# Patient Record
Sex: Female | Born: 1980 | Race: White | Hispanic: No | Smoking: Never smoker
Health system: Southern US, Community
[De-identification: ages and names within clinical notes are randomized; demographics above are authoritative.]

## PROBLEM LIST (undated history)

## (undated) DIAGNOSIS — M069 Rheumatoid arthritis, unspecified: Secondary | ICD-10-CM

## (undated) DIAGNOSIS — R51 Headache: Secondary | ICD-10-CM

## (undated) DIAGNOSIS — R569 Unspecified convulsions: Secondary | ICD-10-CM

## (undated) DIAGNOSIS — F419 Anxiety disorder, unspecified: Secondary | ICD-10-CM

## (undated) DIAGNOSIS — F4010 Social phobia, unspecified: Secondary | ICD-10-CM

## (undated) DIAGNOSIS — R232 Flushing: Secondary | ICD-10-CM

## (undated) DIAGNOSIS — M797 Fibromyalgia: Secondary | ICD-10-CM

## (undated) DIAGNOSIS — R55 Syncope and collapse: Secondary | ICD-10-CM

## (undated) DIAGNOSIS — R519 Headache, unspecified: Secondary | ICD-10-CM

## (undated) DIAGNOSIS — F4310 Post-traumatic stress disorder, unspecified: Secondary | ICD-10-CM

## (undated) HISTORY — DX: Post-traumatic stress disorder, unspecified: F43.10

## (undated) HISTORY — DX: Fibromyalgia: M79.7

## (undated) HISTORY — DX: Rheumatoid arthritis, unspecified: M06.9

---

## 2006-05-28 ENCOUNTER — Observation Stay: Payer: Self-pay

## 2006-08-21 ENCOUNTER — Inpatient Hospital Stay: Payer: Self-pay

## 2006-09-02 ENCOUNTER — Emergency Department: Payer: Self-pay | Admitting: General Practice

## 2014-02-11 DIAGNOSIS — R519 Headache, unspecified: Secondary | ICD-10-CM | POA: Insufficient documentation

## 2014-02-11 DIAGNOSIS — F4010 Social phobia, unspecified: Secondary | ICD-10-CM | POA: Insufficient documentation

## 2014-02-11 DIAGNOSIS — F411 Generalized anxiety disorder: Secondary | ICD-10-CM | POA: Insufficient documentation

## 2014-02-11 DIAGNOSIS — R55 Syncope and collapse: Secondary | ICD-10-CM | POA: Insufficient documentation

## 2014-02-11 DIAGNOSIS — R51 Headache: Secondary | ICD-10-CM

## 2014-02-11 DIAGNOSIS — F419 Anxiety disorder, unspecified: Secondary | ICD-10-CM | POA: Insufficient documentation

## 2014-04-05 DIAGNOSIS — R6889 Other general symptoms and signs: Secondary | ICD-10-CM

## 2014-04-05 DIAGNOSIS — R569 Unspecified convulsions: Secondary | ICD-10-CM | POA: Insufficient documentation

## 2014-04-05 DIAGNOSIS — IMO0001 Reserved for inherently not codable concepts without codable children: Secondary | ICD-10-CM | POA: Insufficient documentation

## 2015-02-01 ENCOUNTER — Emergency Department
Admission: EM | Admit: 2015-02-01 | Discharge: 2015-02-01 | Disposition: A | Discharge status: Home / Self Care | Payer: Medicaid Other | Attending: Emergency Medicine | Admitting: Emergency Medicine

## 2015-02-01 ENCOUNTER — Emergency Department: Payer: Medicaid Other

## 2015-02-01 ENCOUNTER — Encounter: Payer: Self-pay | Admitting: Emergency Medicine

## 2015-02-01 DIAGNOSIS — S3992XA Unspecified injury of lower back, initial encounter: Secondary | ICD-10-CM | POA: Diagnosis not present

## 2015-02-01 DIAGNOSIS — S7011XA Contusion of right thigh, initial encounter: Secondary | ICD-10-CM | POA: Diagnosis not present

## 2015-02-01 DIAGNOSIS — Y9389 Activity, other specified: Secondary | ICD-10-CM | POA: Insufficient documentation

## 2015-02-01 DIAGNOSIS — W108XXA Fall (on) (from) other stairs and steps, initial encounter: Secondary | ICD-10-CM | POA: Insufficient documentation

## 2015-02-01 DIAGNOSIS — S50311A Abrasion of right elbow, initial encounter: Secondary | ICD-10-CM | POA: Insufficient documentation

## 2015-02-01 DIAGNOSIS — Y998 Other external cause status: Secondary | ICD-10-CM | POA: Diagnosis not present

## 2015-02-01 DIAGNOSIS — M545 Low back pain, unspecified: Secondary | ICD-10-CM

## 2015-02-01 DIAGNOSIS — Z3202 Encounter for pregnancy test, result negative: Secondary | ICD-10-CM | POA: Diagnosis not present

## 2015-02-01 DIAGNOSIS — S80211A Abrasion, right knee, initial encounter: Secondary | ICD-10-CM | POA: Insufficient documentation

## 2015-02-01 DIAGNOSIS — S161XXA Strain of muscle, fascia and tendon at neck level, initial encounter: Secondary | ICD-10-CM | POA: Diagnosis not present

## 2015-02-01 DIAGNOSIS — S299XXA Unspecified injury of thorax, initial encounter: Secondary | ICD-10-CM | POA: Insufficient documentation

## 2015-02-01 DIAGNOSIS — S199XXA Unspecified injury of neck, initial encounter: Secondary | ICD-10-CM | POA: Diagnosis present

## 2015-02-01 DIAGNOSIS — M546 Pain in thoracic spine: Secondary | ICD-10-CM

## 2015-02-01 DIAGNOSIS — Y9289 Other specified places as the place of occurrence of the external cause: Secondary | ICD-10-CM | POA: Insufficient documentation

## 2015-02-01 HISTORY — DX: Unspecified convulsions: R56.9

## 2015-02-01 HISTORY — DX: Anxiety disorder, unspecified: F41.9

## 2015-02-01 HISTORY — DX: Social phobia, unspecified: F40.10

## 2015-02-01 HISTORY — DX: Headache, unspecified: R51.9

## 2015-02-01 HISTORY — DX: Flushing: R23.2

## 2015-02-01 HISTORY — DX: Headache: R51

## 2015-02-01 HISTORY — DX: Syncope and collapse: R55

## 2015-02-01 LAB — POCT PREGNANCY, URINE: Preg Test, Ur: NEGATIVE

## 2015-02-01 MED ORDER — NAPROXEN 500 MG PO TABS
500.0000 mg | ORAL_TABLET | Freq: Two times a day (BID) | ORAL | Status: DC
Start: 2015-02-01 — End: 2015-10-15

## 2015-02-01 MED ORDER — HYDROCODONE-ACETAMINOPHEN 5-325 MG PO TABS: 1.0000 | ORAL_TABLET | ORAL | Status: DC | PRN

## 2015-02-01 MED ORDER — HYDROCODONE-ACETAMINOPHEN 5-325 MG PO TABS
1.0000 | ORAL_TABLET | Freq: Once | ORAL | Status: AC
  Administered 2015-02-01: 1 via ORAL
  Filled 2015-02-01: qty 1

## 2015-02-01 NOTE — ED Provider Notes (Signed)
[facility] Emergency Department Provider Note  ____________________________________________  Time seen: Approximately 5:19 PM  I have reviewed the triage vital signs and the nursing notes.   HISTORY  Chief Complaint Fall   HPI Morgan [name] is a 34 y.o. female is here after falling down her porch steps. Patient states that she fell down approximately 5 concrete steps and thinks that she tripped over her cat. She states that she landed on concrete but denies any head injury or loss of consciousness. She states her neck is hurting, her right thigh and knee, also her back is hurting. She denies any lacerations or abrasions. She is not taking any over-the-counter medication prior to coming to the emergency room. She denies any paresthesias in her arms. She denies any previous injuries to her neck or back. Currently her pain is 8 out of 10.   Past Medical History  Diagnosis Date  . Anxiety   . Seizures   . HA (headache)   . Hot flashes   . Social phobia   . Convulsions   . Syncope and collapse     There are no active problems to display for this patient.   History reviewed. No pertinent past surgical history.  Current Outpatient Rx  Name  Route  Sig  Dispense  Refill  . HYDROcodone-acetaminophen (NORCO/VICODIN) 5-325 MG per tablet   Oral   Take 1 tablet by mouth every 4 (four) hours as needed for moderate pain.   20 tablet   0   . naproxen (NAPROSYN) 500 MG tablet   Oral   Take 1 tablet (500 mg total) by mouth 2 (two) times daily with a meal.   30 tablet   0     Allergies Neurontin; Nortriptyline; Lamotrigine; and Levetiracetam  No family history on file.  Social History Social History  Substance Use Topics  . Smoking status: Never Smoker   . Smokeless tobacco: Never Used  . Alcohol Use: No    Review of Systems Constitutional: No fever/chills Eyes: No visual changes. ENT: No sore throat. Cardiovascular: Denies chest  pain. Respiratory: Denies shortness of breath. Gastrointestinal: No abdominal pain.  No nausea, no vomiting.   Musculoskeletal: Positive for cervical thoracic and lumbar pain. Positive for right thigh pain. Skin: Negative for rash. Neurological: Negative for headaches, focal weakness or numbness.  10-point ROS otherwise negative.  ____________________________________________   PHYSICAL EXAM:  VITAL SIGNS: ED Triage Vitals  Enc Vitals Group     BP 02/01/15 1638 135/85 mmHg     Pulse Rate 02/01/15 1638 81     Resp 02/01/15 1638 18     Temp 02/01/15 1638 98.4 F (36.9 C)     Temp src --      SpO2 02/01/15 1638 98 %     Weight 02/01/15 1638 120 lb (54.432 kg)     Height 02/01/15 1638  (1.651 m)     Head Cir --      Peak Flow --      Pain Score 02/01/15 1638 8     Pain Loc --      Pain Edu? --      Excl. in GC? --     Constitutional: Alert and oriented. Well appearing and in no acute distress. Eyes: Conjunctivae are normal. PERRL. EOMI. Head: Atraumatic. Nose: No congestion/rhinnorhea. Neck: No stridor.  Tender cervical spine with cervical collar in place. Range of motion was not tested Cardiovascular: Normal rate, regular rhythm. Grossly normal heart sounds.  Good peripheral circulation. Respiratory: Normal respiratory effort.  No retractions. Lungs CTAB. Gastrointestinal: Soft and nontender. No distention. Bowel sounds are active in 4 quadrants Musculoskeletal: See above cervical spine exam. Right by moderate tenderness on palpation lateral aspect. No obvious deformity and no shortening of limb. Range of motion is restricted secondary to pain.   No joint effusions. Thoracic and lumbar spine moderate tenderness on palpation generalized both on the spine and paravertebral muscles bilaterally. Range of motion is guarded secondary to pain. No active muscle spasms are seen. Neurologic:  Normal speech and language. No gross focal neurologic deficits are appreciated. No gait  instability. Skin:  Skin is warm, dry and intact. No rash noted. There is superficial abrasion to the right elbow and right knee. No active bleeding was noted. Psychiatric: Mood and affect are normal. Speech and behavior are normal.  ____________________________________________   LABS (all labs ordered are listed, but only abnormal results are displayed)  Labs Reviewed  POC URINE PREG, ED  POCT PREGNANCY, URINE    RADIOLOGY  Right femur no fracture per radiologist and reviewed by me. Thoracic spine x-ray no fracture I, Tommi Rumps, personally viewed and evaluated these images (plain radiographs) as part of my medical decision making.   CT cervical spine per radiologist negative exam  ____________________________________________   PROCEDURES  Procedure(s) performed: None  Critical Care performed: No  ____________________________________________   INITIAL IMPRESSION / ASSESSMENT AND PLAN / ED COURSE  Pertinent labs & imaging results that were available during my care of the patient were reviewed by me and considered in my medical decision making (see chart for details  patient is aware that she is going to be extremely sore even with medication. She is use ice or heat to muscles. She is given a prescription for naproxen 500 mg twice a day with food and Norco as needed for severe pain. She is to follow-up with her regular physician if any continued problems.  ____________________________________________   FINAL CLINICAL IMPRESSION(S) / ED DIAGNOSES  Final diagnoses:  Cervical strain, acute, initial encounter  Acute lumbar back pain  Acute thoracic back pain  Contusion of right thigh, initial encounter      Tommi Rumps, PA-C 02/01/15 2032  Sharman Cheek, MD 02/02/15 [phone]

## 2015-02-01 NOTE — ED Notes (Signed)
Pt reports falling down porch steps about 4-5 steps. States she thinks one of her cats tripped her. States she did hit her head denies LOC. Ambulatory at site. Pain to right thigh and right knee. Back pain and neck pain per pt. Abrasion to right elbow and right knee.

## 2015-10-15 ENCOUNTER — Emergency Department
Admission: EM | Admit: 2015-10-15 | Discharge: 2015-10-15 | Disposition: A | Discharge status: Home / Self Care | Payer: Medicaid Other | Attending: Emergency Medicine | Admitting: Emergency Medicine

## 2015-10-15 ENCOUNTER — Emergency Department: Payer: Medicaid Other

## 2015-10-15 ENCOUNTER — Encounter: Payer: Self-pay | Admitting: Emergency Medicine

## 2015-10-15 DIAGNOSIS — Z8669 Personal history of other diseases of the nervous system and sense organs: Secondary | ICD-10-CM | POA: Insufficient documentation

## 2015-10-15 DIAGNOSIS — Y999 Unspecified external cause status: Secondary | ICD-10-CM | POA: Diagnosis not present

## 2015-10-15 DIAGNOSIS — Y9239 Other specified sports and athletic area as the place of occurrence of the external cause: Secondary | ICD-10-CM | POA: Insufficient documentation

## 2015-10-15 DIAGNOSIS — S93401A Sprain of unspecified ligament of right ankle, initial encounter: Secondary | ICD-10-CM | POA: Insufficient documentation

## 2015-10-15 DIAGNOSIS — Y9301 Activity, walking, marching and hiking: Secondary | ICD-10-CM | POA: Diagnosis not present

## 2015-10-15 DIAGNOSIS — S99911A Unspecified injury of right ankle, initial encounter: Secondary | ICD-10-CM | POA: Diagnosis present

## 2015-10-15 DIAGNOSIS — X503XXA Overexertion from repetitive movements, initial encounter: Secondary | ICD-10-CM | POA: Diagnosis not present

## 2015-10-15 MED ORDER — HYDROCODONE-ACETAMINOPHEN 5-325 MG PO TABS: 1.0000 | ORAL_TABLET | ORAL | Status: DC | PRN

## 2015-10-15 MED ORDER — MELOXICAM 15 MG PO TABS: 15.0000 mg | ORAL_TABLET | Freq: Every day | ORAL | Status: DC

## 2015-10-15 MED ORDER — BACLOFEN 10 MG PO TABS
10.0000 mg | ORAL_TABLET | Freq: Three times a day (TID) | ORAL | Status: DC
Start: 2015-10-15 — End: 2016-08-05

## 2015-10-15 NOTE — ED Notes (Signed)
Discussed discharge instructions, prescriptions, and follow-up care with patient. No questions or concerns at this time. Pt stable at discharge.  

## 2015-10-15 NOTE — ED Notes (Signed)
Patient presents to the ED with right ankle pain since Thursday.  Patient states she injured her ankle while walking on the treadmill at the gym.  Patient states she was seen at an urgent care on Monday but did not get x-rays at that time and was told that pain would be improved by today.  Patient states pain has gotten worse instead of better.

## 2015-10-15 NOTE — Discharge Instructions (Signed)
Ankle Sprain °An ankle sprain is an injury to the strong, fibrous tissues (ligaments) that hold the bones of your ankle joint together.  °CAUSES °An ankle sprain is usually caused by a fall or by twisting your ankle. Ankle sprains most commonly occur when you step on the outer edge of your foot, and your ankle turns inward. People who participate in sports are more prone to these types of injuries.  °SYMPTOMS  °· Pain in your ankle. The pain may be present at rest or only when you are trying to stand or walk. °· Swelling. °· Bruising. Bruising may develop immediately or within 1 to 2 days after your injury. °· Difficulty standing or walking, particularly when turning corners or changing directions. °DIAGNOSIS  °Your caregiver will ask you details about your injury and perform a physical exam of your ankle to determine if you have an ankle sprain. During the physical exam, your caregiver will press on and apply pressure to specific areas of your foot and ankle. Your caregiver will try to move your ankle in certain ways. An X-ray exam may be done to be sure a bone was not broken or a ligament did not separate from one of the bones in your ankle (avulsion fracture).  °TREATMENT  °Certain types of braces can help stabilize your ankle. Your caregiver can make a recommendation for this. Your caregiver may recommend the use of medicine for pain. If your sprain is severe, your caregiver may refer you to a surgeon who helps to restore function to parts of your skeletal system (orthopedist) or a physical therapist. °HOME CARE INSTRUCTIONS  °· Apply ice to your injury for 1-2 days or as directed by your caregiver. Applying ice helps to reduce inflammation and pain. °· Put ice in a plastic bag. °· Place a towel between your skin and the bag. °· Leave the ice on for 15-20 minutes at a time, every 2 hours while you are awake. °· Only take over-the-counter or prescription medicines for pain, discomfort, or fever as directed by  your caregiver. °· Elevate your injured ankle above the level of your heart as much as possible for 2-3 days. °· If your caregiver recommends crutches, use them as instructed. Gradually put weight on the affected ankle. Continue to use crutches or a cane until you can walk without feeling pain in your ankle. °· If you have a plaster splint, wear the splint as directed by your caregiver. Do not rest it on anything harder than a pillow for the first 24 hours. Do not put weight on it. Do not get it wet. You may take it off to take a shower or bath. °· You may have been given an elastic bandage to wear around your ankle to provide support. If the elastic bandage is too tight (you have numbness or tingling in your foot or your foot becomes cold and blue), adjust the bandage to make it comfortable. °· If you have an air splint, you may blow more air into it or let air out to make it more comfortable. You may take your splint off at night and before taking a shower or bath. Wiggle your toes in the splint several times per day to decrease swelling. °SEEK MEDICAL CARE IF:  °· You have rapidly increasing bruising or swelling. °· Your toes feel extremely cold or you lose feeling in your foot. °· Your pain is not relieved with medicine. °SEEK IMMEDIATE MEDICAL CARE IF: °· Your toes are numb or blue. °·   You have severe pain that is increasing. °MAKE SURE YOU:  °· Understand these instructions. °· Will watch your condition. °· Will get help right away if you are not doing well or get worse. °  °This information is not intended to replace advice given to you by your health care provider. Make sure you discuss any questions you have with your health care provider. °  °Document Released: 05/10/2005 Document Revised: 05/31/2014 Document Reviewed: 05/22/2011 °Elsevier Interactive Patient Education ©2016 [employer] ° °Cryotherapy °Cryotherapy means treatment with cold. Ice or gel packs can be used to reduce both pain and swelling.  Ice is the most helpful within the first 24 to 48 hours after an injury or flare-up from overusing a muscle or joint. Sprains, strains, spasms, burning pain, shooting pain, and aches can all be eased with ice. Ice can also be used when recovering from surgery. Ice is effective, has very few side effects, and is safe for most people to use. °PRECAUTIONS  °Ice is not a safe treatment option for people with: °· Raynaud phenomenon. This is a condition affecting small blood vessels in the extremities. Exposure to cold may cause your problems to return. °· Cold hypersensitivity. There are many forms of cold hypersensitivity, including: °· Cold urticaria. Red, itchy hives appear on the skin when the tissues begin to warm after being iced. °· Cold erythema. This is a red, itchy rash caused by exposure to cold. °· Cold hemoglobinuria. Red blood cells break down when the tissues begin to warm after being iced. The hemoglobin that carry oxygen are passed into the urine because they cannot combine with blood proteins fast enough. °· Numbness or altered sensitivity in the area being iced. °If you have any of the following conditions, do not use ice until you have discussed cryotherapy with your caregiver: °· Heart conditions, such as arrhythmia, angina, or chronic heart disease. °· High blood pressure. °· Healing wounds or open skin in the area being iced. °· Current infections. °· Rheumatoid arthritis. °· Poor circulation. °· Diabetes. °Ice slows the blood flow in the region it is applied. This is beneficial when trying to stop inflamed tissues from spreading irritating chemicals to surrounding tissues. However, if you expose your skin to cold temperatures for too long or without the proper protection, you can damage your skin or nerves. Watch for signs of skin damage due to cold. °HOME CARE INSTRUCTIONS °Follow these tips to use ice and cold packs safely. °· Place a dry or damp towel between the ice and skin. A damp towel will  cool the skin more quickly, so you may need to shorten the time that the ice is used. °· For a more rapid response, add gentle compression to the ice. °· Ice for no more than 10 to 20 minutes at a time. The bonier the area you are icing, the less time it will take to get the benefits of ice. °· Check your skin after 5 minutes to make sure there are no signs of a poor response to cold or skin damage. °· Rest 20 minutes or more between uses. °· Once your skin is numb, you can end your treatment. You can test numbness by very lightly touching your skin. The touch should be so light that you do not see the skin dimple from the pressure of your fingertip. When using ice, most people will feel these normal sensations in this order: cold, burning, aching, and numbness. °· Do not use ice on someone who   cannot communicate their responses to pain, such as small children or people with dementia. °HOW TO MAKE AN ICE PACK °Ice packs are the most common way to use ice therapy. Other methods include ice massage, ice baths, and cryosprays. Muscle creams that cause a cold, tingly feeling do not offer the same benefits that ice offers and should not be used as a substitute unless recommended by your caregiver. °To make an ice pack, do one of the following: °· Place crushed ice or a bag of frozen vegetables in a sealable plastic bag. Squeeze out the excess air. Place this bag inside another plastic bag. Slide the bag into a pillowcase or place a damp towel between your skin and the bag. °· Mix 3 parts water with 1 part rubbing alcohol. Freeze the mixture in a sealable plastic bag. When you remove the mixture from the freezer, it will be slushy. Squeeze out the excess air. Place this bag inside another plastic bag. Slide the bag into a pillowcase or place a damp towel between your skin and the bag. °SEEK MEDICAL CARE IF: °· You develop white spots on your skin. This may give the skin a blotchy (mottled) appearance. °· Your skin turns  blue or pale. °· Your skin becomes waxy or hard. °· Your swelling gets worse. °MAKE SURE YOU:  °· Understand these instructions. °· Will watch your condition. °· Will get help right away if you are not doing well or get worse. °  °This information is not intended to replace advice given to you by your health care provider. Make sure you discuss any questions you have with your health care provider. °  °Document Released: 01/04/2011 Document Revised: 05/31/2014 Document Reviewed: 01/04/2011 °Elsevier Interactive Patient Education ©2016 [employer] Inc[employer] ° °Stirrup Ankle Brace °Stirrup ankle braces give support and help stabilize the ankle joint. They are rigid pieces of plastic or fiberglass that go up both sides of the lower leg with the bottom of the stirrup fitting comfortably under the bottom of the instep of the foot. It can be held on with Velcro straps or an elastic wrap. Stirrup ankle braces are used to support the ankle following mild or moderate sprains or strains, or fractures after cast removal.  °They can be easily removed or adjusted if there is swelling. The rigid brace shells are designed to fit the ankle comfortably and provide the needed medial/lateral stabilization. This brace can be easily worn with most athletic shoes. The brace liner is usually made of a soft, comfortable gel-like material. This gel fits the ankle well without causing uncomfortable pressure points.  °IMPORTANCE OF ANKLE BRACES: °· The use of ankle bracing is effective in the prevention of ankle sprains. °· In athletes, the use of ankle bracing will offer protection and prevent further sprains. °· Research shows that a complete rehabilitation program needs to be included with external bracing. This includes range of motion and ankle strengthening exercises. Your caregivers will instruct you in this. °If you were given the brace today for a new injury, use the following home care instructions as a guide. °HOME CARE INSTRUCTIONS   °· Apply ice to the sore area for 15-20 minutes, 03-04 times per day while awake for the first 2 days. Put the ice in a plastic bag and place a towel between the bag of ice and your skin. Never place the ice pack directly on your skin. Be especially careful using ice on an elbow or knee or other bony area, such as   your ankle, because icing for too long may damage the nerves which are close to the surface. °· Keep your leg elevated when possible to lessen swelling. °· Wear your splint until you are seen for a follow-up examination. Do not put weight on it. Do not get it wet. You may take it off to take a shower or bath. °· For Activity: Use crutches with non-weight bearing for 1 week. Then, you may walk on your ankle as instructed. Start gradually with weight bearing on the affected ankle. °· Continue to use crutches or a cane until you can stand on your ankle without causing pain. °· Wiggle your toes in the splint several times per day if you are able. °· The splint is too tight if you have numbness, tingling, or if your foot becomes cold and blue. Adjust the straps or elastic bandage to make it comfortable. °· Only take over-the-counter or prescription medicines for pain, discomfort, or fever as directed by your caregiver. °SEEK IMMEDIATE MEDICAL CARE IF:  °· You have increased bruising, swelling or pain. °· Your toes are blue or cold and loosening the brace or wrap does not help. °· Your pain is not relieved with medicine. °MAKE SURE YOU:  °· Understand these instructions. °· Will watch your condition. °· Will get help right away if you are not doing well or get worse. °  °This information is not intended to replace advice given to you by your health care provider. Make sure you discuss any questions you have with your health care provider. °  °Document Released: 03/10/2004 Document Revised: 08/02/2011 Document Reviewed: 12/10/2014 °Elsevier Interactive Patient Education ©2016 Elsevier Inc. ° °

## 2015-10-15 NOTE — ED Provider Notes (Signed)
[facility] Emergency Department Provider Note  ____________________________________________  Time seen: Approximately 9:53 AM  I have reviewed the triage vital signs and the nursing notes.   HISTORY  Chief Complaint Ankle Injury    HPI Morgan [name] is a 35 y.o. female since for evaluation of right ankle pain 1 week. He states that she injured ankle walking on the treadmill gym. See effusion care 2 days ago but tonight x-rays that time was told pain was improved by today. Patient states pain is getting worse.   Past Medical History  Diagnosis Date  . Anxiety   . Seizures (HCC)   . HA (headache)   . Hot flashes   . Social phobia   . Convulsions (HCC)   . Syncope and collapse     There are no active problems to display for this patient.   History reviewed. No pertinent past surgical history.  Current Outpatient Rx  Name  Route  Sig  Dispense  Refill  . baclofen (LIORESAL) 10 MG tablet   Oral   Take 1 tablet (10 mg total) by mouth 3 (three) times daily.   30 tablet   0   . HYDROcodone-acetaminophen (NORCO) 5-325 MG tablet   Oral   Take 1-2 tablets by mouth every 4 (four) hours as needed for moderate pain.   15 tablet   0   . meloxicam (MOBIC) 15 MG tablet   Oral   Take 1 tablet (15 mg total) by mouth daily.   30 tablet   0     Allergies Neurontin; Nortriptyline; Lamotrigine; and Levetiracetam  No family history on file.  Social History Social History  Substance Use Topics  . Smoking status: Never Smoker   . Smokeless tobacco: Never Used  . Alcohol Use: No    Review of Systems Constitutional: No fever/chills Musculoskeletal: Positive for right ankle pain 1 week. Neurological: Negative for headaches, focal weakness or numbness.  10-point ROS otherwise negative.  ____________________________________________   PHYSICAL EXAM:  VITAL SIGNS: ED Triage Vitals  Enc Vitals Group     BP 10/15/15 0934 141/70 mmHg   Pulse Rate 10/15/15 0934 70     Resp 10/15/15 0934 18     Temp 10/15/15 0934 99.4 F (37.4 C)     Temp Source 10/15/15 0934 Oral     SpO2 10/15/15 0934 100 %     Weight 10/15/15 0930 132 lb (59.875 kg)     Height 10/15/15 0930  (1.651 m)     Head Cir --      Peak Flow --      Pain Score 10/15/15 0930 9     Pain Loc --      Pain Edu? --      Excl. in GC? --     Constitutional: Alert and oriented. Well appearing and in no acute distress. Musculoskeletal: No effusion noted to the medial aspect of the right ankle with good pulses good capillary refill distal neurovascularly intact. Limited range of motion with extension and with lateralization. No ecchymosis or bruising noted. Neurologic:  Normal speech and language. No gross focal neurologic deficits are appreciated. No gait instability. Skin:  Skin is warm, dry and intact. No rash noted. Psychiatric: Mood and affect are normal. Speech and behavior are normal.  ____________________________________________   LABS (all labs ordered are listed, but only abnormal results are displayed)  Labs Reviewed - No data to display ____________________________________________  EKG   ____________________________________________  RADIOLOGY FINDINGS: Mild  soft tissue swelling especially anteriorly and medially.  Osseous mineralization normal.  Joint spaces preserved.  No acute fracture, dislocation, or bone destruction.  IMPRESSION: No acute osseous abnormalities.  ____________________________________________   PROCEDURES  Procedure(s) performed: None  Critical Care performed: No  ____________________________________________   INITIAL IMPRESSION / ASSESSMENT AND PLAN / ED COURSE  Pertinent labs & imaging results that were available during my care of the patient were reviewed by me and considered in my medical decision making (see chart for details).  Acute right ankle sprain. Reassurance to the patient encouraged  to use of crutches and placed ankle in a splint for comfort ice and elevation for the next 2448 hrs. follow-up with orthopedics on-call symptoms seemed not to get better. Rx was given for meloxicam and hydrocodone. ____________________________________________   FINAL CLINICAL IMPRESSION(S) / ED DIAGNOSES  Final diagnoses:  Ankle sprain, right, initial encounter     This chart was dictated using voice recognition software/Dragon. Despite best efforts to proofread, errors can occur which can change the meaning. Any change was purely unintentional.   Evangeline Dakinharles M Yuriel Lopezmartinez, PA-C 10/15/15 1019  Arnaldo NatalPaul F Malinda, MD 10/15/15 [phone]

## 2016-06-16 DIAGNOSIS — N912 Amenorrhea, unspecified: Secondary | ICD-10-CM | POA: Insufficient documentation

## 2016-06-16 DIAGNOSIS — G43109 Migraine with aura, not intractable, without status migrainosus: Secondary | ICD-10-CM | POA: Insufficient documentation

## 2016-06-16 DIAGNOSIS — M546 Pain in thoracic spine: Secondary | ICD-10-CM

## 2016-06-16 DIAGNOSIS — G43009 Migraine without aura, not intractable, without status migrainosus: Secondary | ICD-10-CM | POA: Insufficient documentation

## 2016-06-16 DIAGNOSIS — F4001 Agoraphobia with panic disorder: Secondary | ICD-10-CM | POA: Insufficient documentation

## 2016-06-16 DIAGNOSIS — L700 Acne vulgaris: Secondary | ICD-10-CM | POA: Insufficient documentation

## 2016-06-16 DIAGNOSIS — G8929 Other chronic pain: Secondary | ICD-10-CM | POA: Insufficient documentation

## 2016-08-05 ENCOUNTER — Encounter: Payer: Self-pay | Admitting: Obstetrics and Gynecology

## 2016-08-05 ENCOUNTER — Ambulatory Visit (INDEPENDENT_AMBULATORY_CARE_PROVIDER_SITE_OTHER): Payer: Medicaid Other | Admitting: Obstetrics and Gynecology

## 2016-08-05 VITALS — BP 104/64 | HR 73 | Ht 65.5 in | Wt 134.0 lb

## 2016-08-05 DIAGNOSIS — Z3202 Encounter for pregnancy test, result negative: Secondary | ICD-10-CM

## 2016-08-05 DIAGNOSIS — R519 Headache, unspecified: Secondary | ICD-10-CM | POA: Insufficient documentation

## 2016-08-05 DIAGNOSIS — N926 Irregular menstruation, unspecified: Secondary | ICD-10-CM | POA: Diagnosis not present

## 2016-08-05 DIAGNOSIS — N911 Secondary amenorrhea: Secondary | ICD-10-CM | POA: Diagnosis not present

## 2016-08-05 DIAGNOSIS — N912 Amenorrhea, unspecified: Secondary | ICD-10-CM

## 2016-08-05 DIAGNOSIS — F4010 Social phobia, unspecified: Secondary | ICD-10-CM | POA: Insufficient documentation

## 2016-08-05 DIAGNOSIS — R232 Flushing: Secondary | ICD-10-CM | POA: Insufficient documentation

## 2016-08-05 DIAGNOSIS — R51 Headache: Secondary | ICD-10-CM

## 2016-08-05 LAB — POCT URINE PREGNANCY: PREG TEST UR: NEGATIVE

## 2016-08-05 NOTE — Progress Notes (Signed)
Obstetrics & Gynecology Office Visit   Chief Complaint  Patient presents with  . Menstrual Problem   History of Present Illness: The patient is a 36 year old G1 P31 female who presents in referral from Dr. Delano Metz at [facility] for evaluation and management of amenorrhea.   She has a history of Depo-Provera use for the past 3 years. Her last injection was July 2017. Since that time she's had no menses. She began taking Depo-Provera due to very heavy painful periods with large clots. Because she has a history of migraine headaches, she has not traditionally been a good candidate for estrogen containing medication. With Depo-Provera she also noticed a weight gain of about 15 pounds over 3 months. Starting last month she began having very bad cramps, but no menstrual bleeding. She tried taking Excedrin for these cramps with no relief. She takes Norco for her headaches and used Norco for her cramps. She states that she only uses Norco very sparingly for cramps because she wants to save them for her headaches. She also notices a decrease in libido, which is not a new problem for her. This last month that she had very tender breasts with firmness of her nipples that were very tender to touch. This has largely resolved. She does not believe she is pregnant because she has not had intercourse since September of last year. She denies hot flashes and vaginal dryness.   Review of Systems: Review of Systems  Constitutional: Negative.   HENT: Negative.   Eyes: Negative.   Respiratory: Negative.   Cardiovascular: Negative.   Gastrointestinal: Positive for abdominal pain. Negative for blood in stool, constipation, diarrhea, heartburn, melena, nausea and vomiting.  Genitourinary:       Otherwise negative unless noted in HPI.  Musculoskeletal: Negative.   Skin: Negative.   Neurological: Positive for headaches. Negative for tremors and focal weakness.  Endo/Heme/Allergies: Negative.    Psychiatric/Behavioral: Negative.    Past Medical History:  Diagnosis Date  . Anxiety   . Convulsions (HCC)   . HA (headache)   . Hot flashes   . Seizures (HCC)   . Social phobia   . Syncope and collapse    Past Surgical History: No surgeries  Gynecologic History: No LMP recorded. Patient is not currently having periods (Reason: Irregular Periods).  Obstetric History: [id number] (twins)  Family History: History reviewed. No pertinent family history.  No history of gynecologic cancer.  Social History   Social History  . Marital status: Married    Spouse name: N/A  . Number of children: N/A  . Years of education: N/A   Occupational History  . Not on file.   Social History Main Topics  . Smoking status: Never Smoker  . Smokeless tobacco: Never Used  . Alcohol use No  . Drug use: No  . Sexual activity: Not Currently    Birth control/ protection: Condom   Other Topics Concern  . Not on file   Social History Narrative  . No narrative on file    Allergies  Allergen Reactions  . Neurontin [Gabapentin] Other (See Comments)    Headaches   . Nortriptyline Other (See Comments)  . Lamotrigine Rash  . Levetiracetam Rash    Medications:   Medication Sig Start Date End Date Taking? Authorizing Provider  ALPRAZolam Prudy Feeler) 1 MG tablet Take by mouth. 07/22/16  Yes Historical Provider, MD  clindamycin-benzoyl peroxide (BENZACLIN) gel Apply topically. 06/16/16 06/16/17 Yes Historical Provider, MD  HYDROcodone-acetaminophen (NORCO/VICODIN) 5-325 MG  tablet Take by mouth. 07/23/16  Yes Historical Provider, MD  SUMAtriptan (IMITREX) 100 MG tablet Use one tablet at onset of headache. No more than 2 per day or 6 per week. 04/23/16 04/23/17 Yes Historical Provider, MD  HYDROcodone-acetaminophen (NORCO) 5-325 MG tablet Take 1-2 tablets by mouth every 4 (four) hours as needed for moderate pain. 10/15/15   Evangeline Dakinharles M Beers, PA-C    Physical Exam Vitals:  Vitals:   08/05/16 0939  BP:  104/64  Pulse: 73   No LMP recorded. Patient is not currently having periods (Reason: Irregular Periods).  General: NAD HEENT: normocephalic, anicteric Thyroid: no enlargement, no palpable nodules Pulmonary: No increased work of breathing Cardiovascular: RRR, distal pulses 2+ Abdomen: NABS, soft, non-tender, non-distended.  Umbilicus without lesions.  No hepatomegaly, splenomegaly or masses palpable. No evidence of hernia  Genitourinary: deferred today. Extremities: no edema, erythema, or tenderness Neurologic: Grossly intact Psychiatric: mood appropriate, affect full  Assessment: 36 y.o. G1P1002 With secondary amenorrhea. He had a negative urine pregnancy test today. Most likely etiology of her amenorrhea is Depo-Provera. However, we'll obtain a pelvic ultrasound to assess her uterus. If normal, will allow her several more months to have spontaneous menses. Afterward, may need to provoke a withdrawal bleed. I am very hesitant to give her hormonal medication that contains estrogen, given her history of migraine headaches without aura. Still no withdrawal bleed, we'll initiate more intense endocrine workup.  Plan: Problem List Items Addressed This Visit    Amenorrhea - Primary   Relevant Orders   POCT urine pregnancy (Completed)   Secondary amenorrhea   Relevant Orders   US Transvaginal Non-OB     Thomasene MohairStephen Hammond Obeirne, MD 08/05/2016 12:12 PM    CC: Dr. Royston SinnerKip Corrington [facility] [address] [place], [place] [id number] P: [phone] F: [phone]

## 2016-08-26 IMAGING — CR DG FEMUR 2+V*R*
4 series · 4 of 4 positions shown · non-contrast
Comparison: None.

CLINICAL DATA: Acute right thigh pain after fall down steps on
porch at home. Initial encounter.

EXAM:
RIGHT FEMUR 2 VIEWS

[femur ap (1 of 2)]
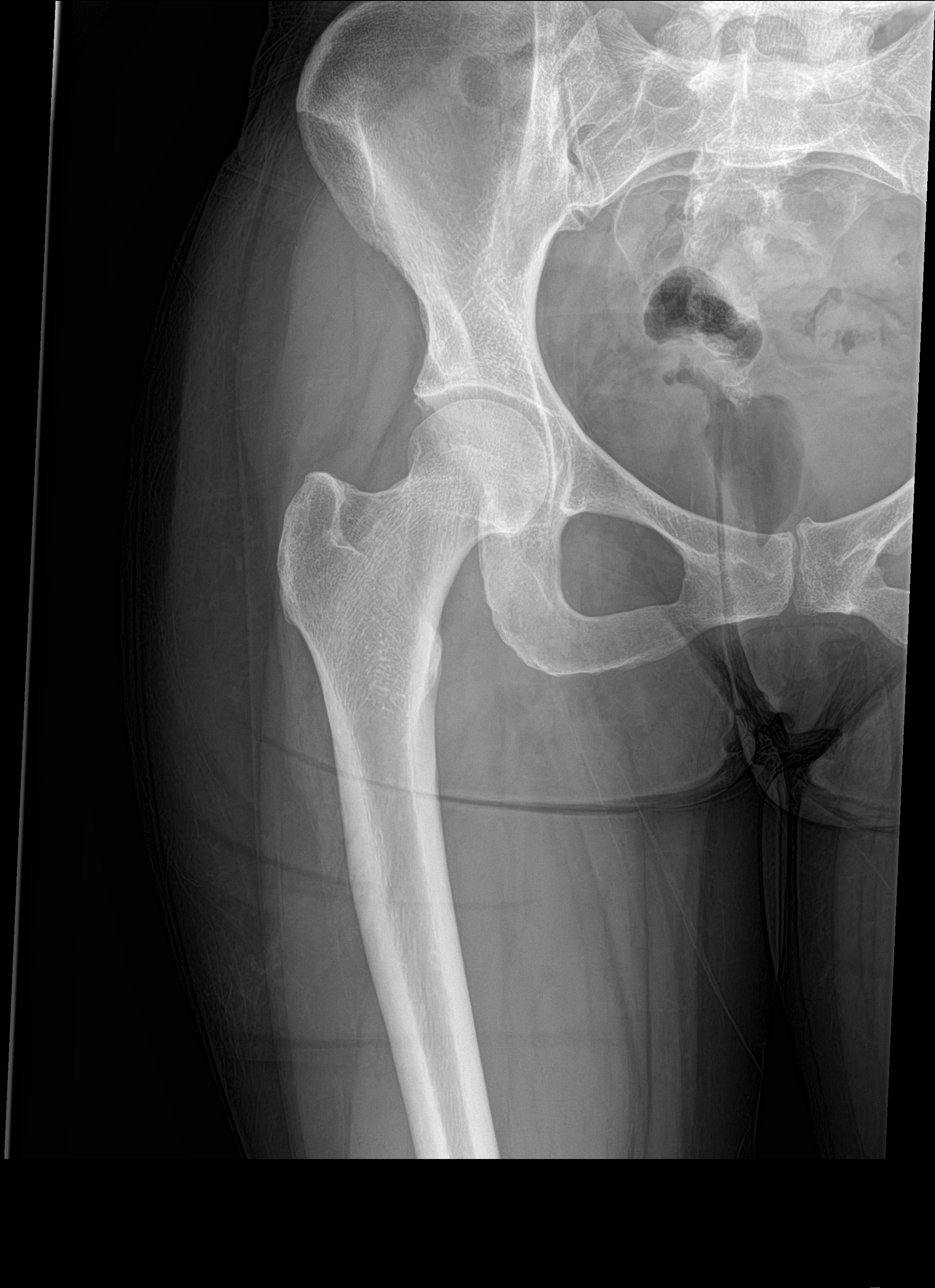

[femur ap (2 of 2)]
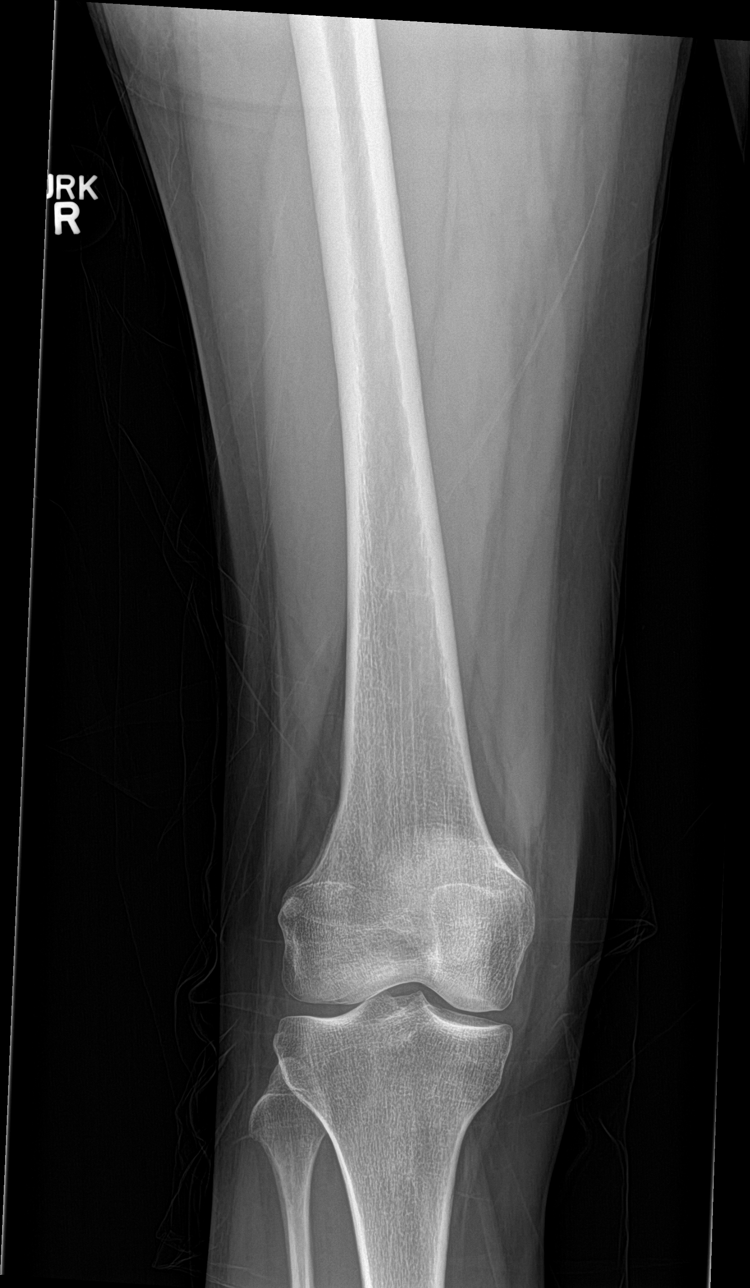

[femur lat (1 of 2)]
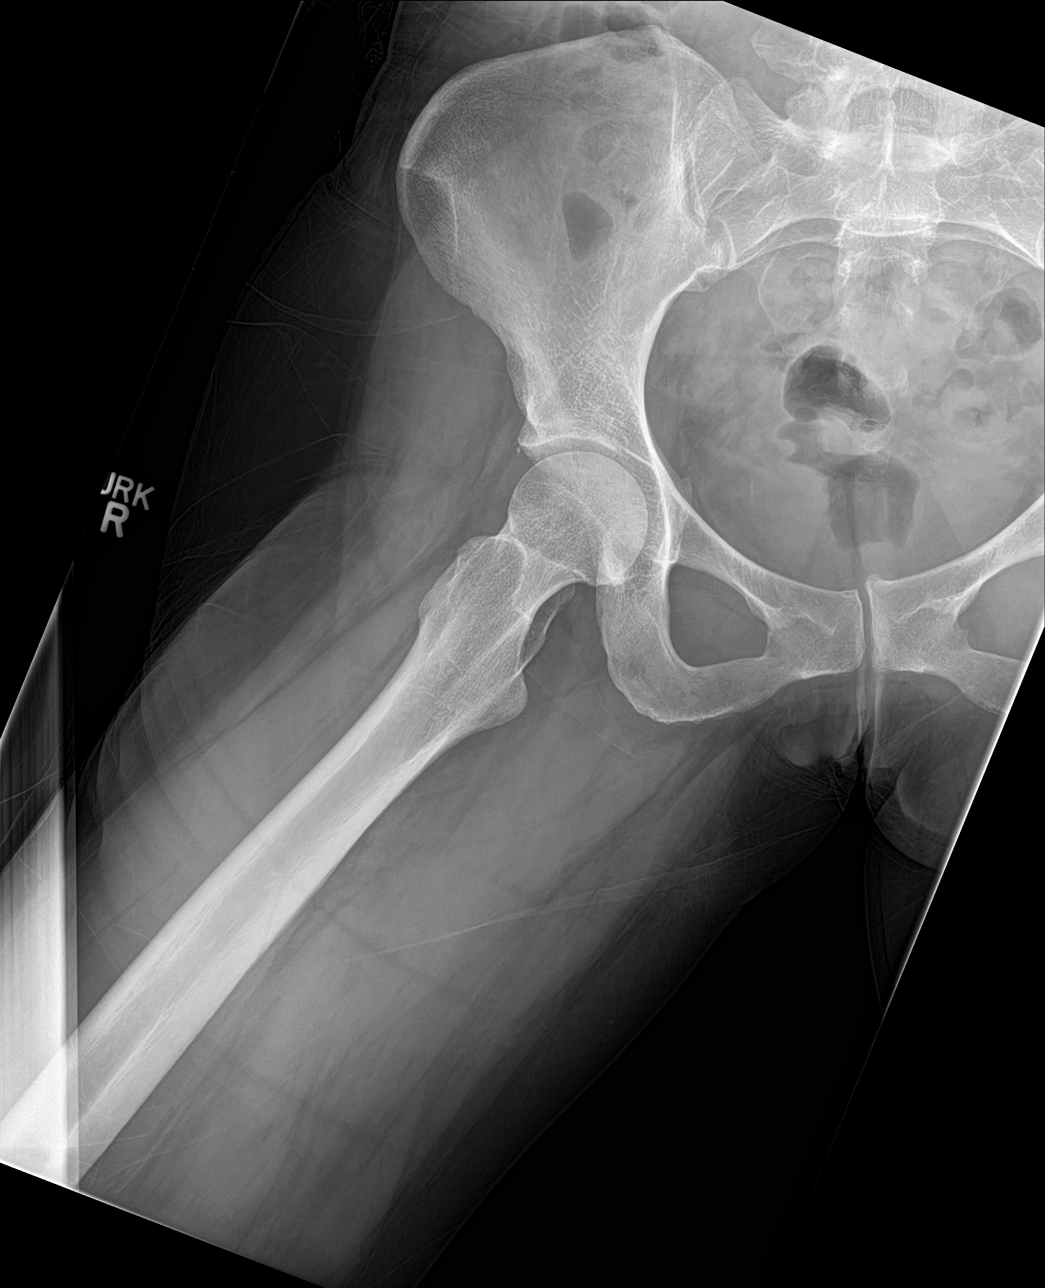

[femur lat (2 of 2)]
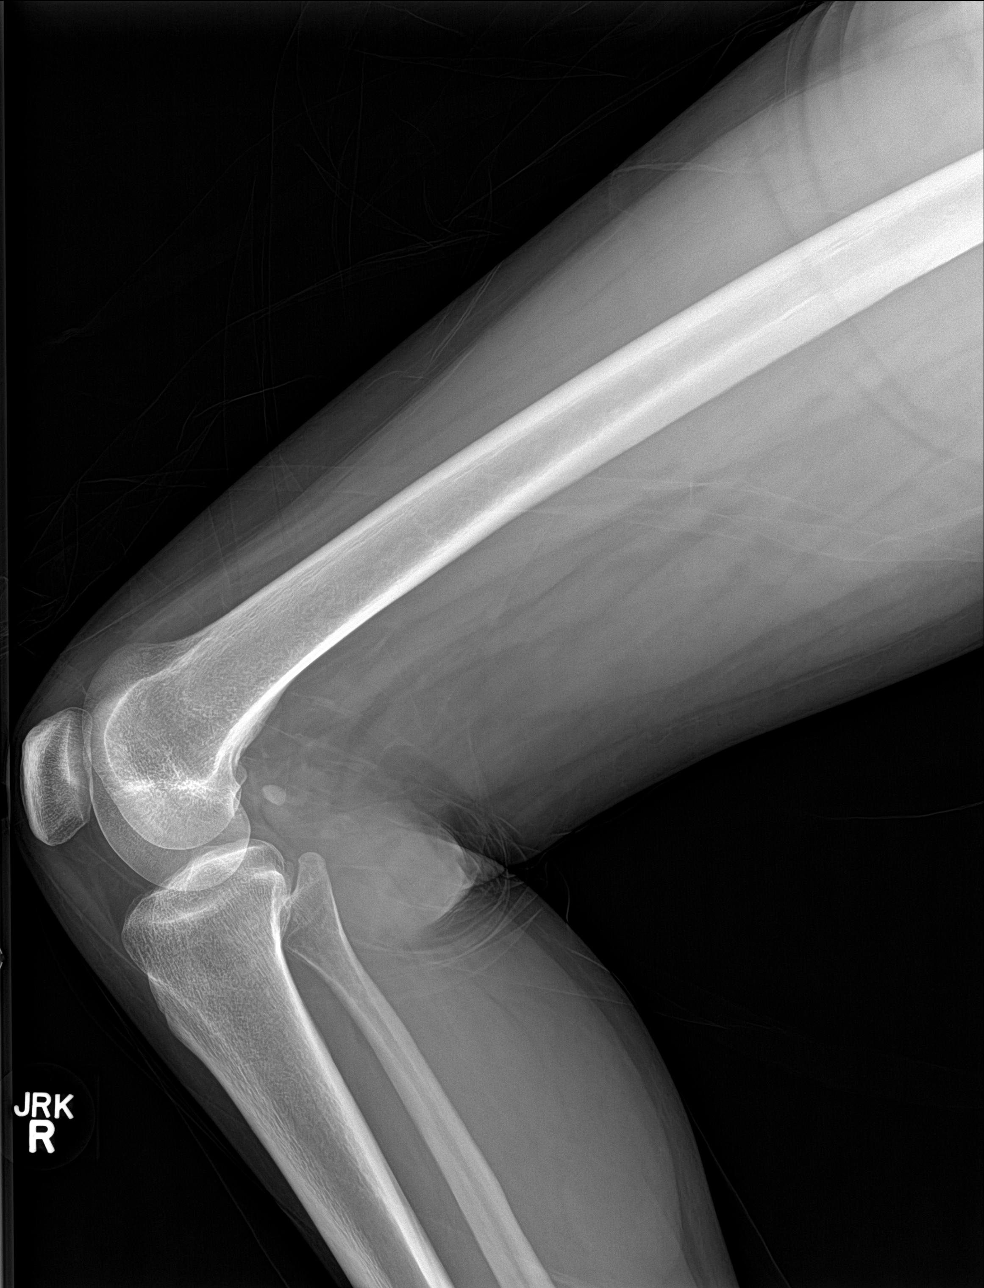

[4 of 4 positions shown; findings below may reference images not displayed]

FINDINGS: There is no evidence of fracture or other focal bone lesions. Soft
tissues are unremarkable.
IMPRESSION: Normal right femur.

## 2016-08-26 IMAGING — CR DG THORACIC SPINE 2V
3 series · 3 of 3 positions shown · non-contrast
Comparison: None.

CLINICAL DATA: Fall down cement steps off back porch. Mid back
pain.

EXAM:
THORACIC SPINE 2 VIEWS

[t-spine ap]
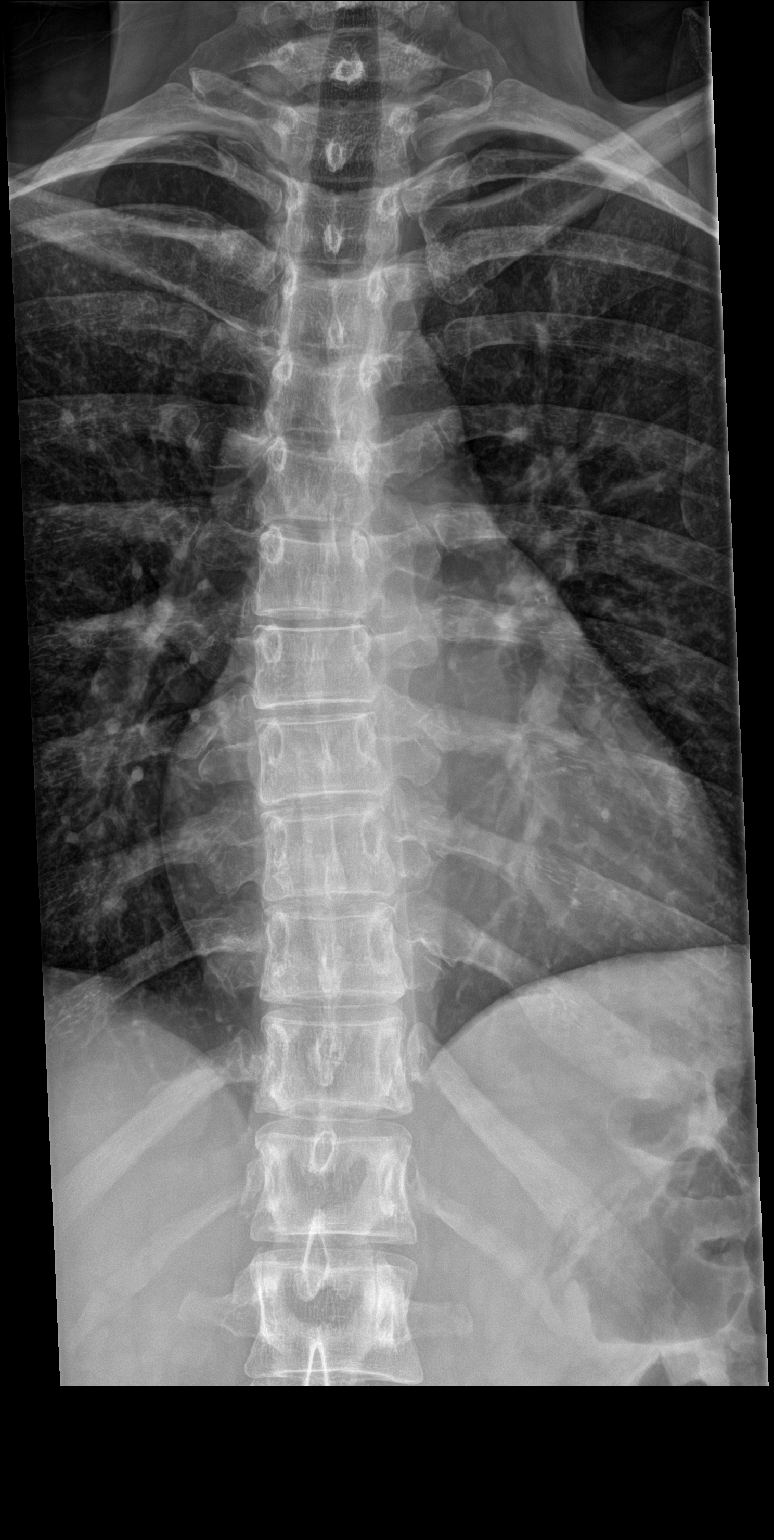

[t-spine lat]
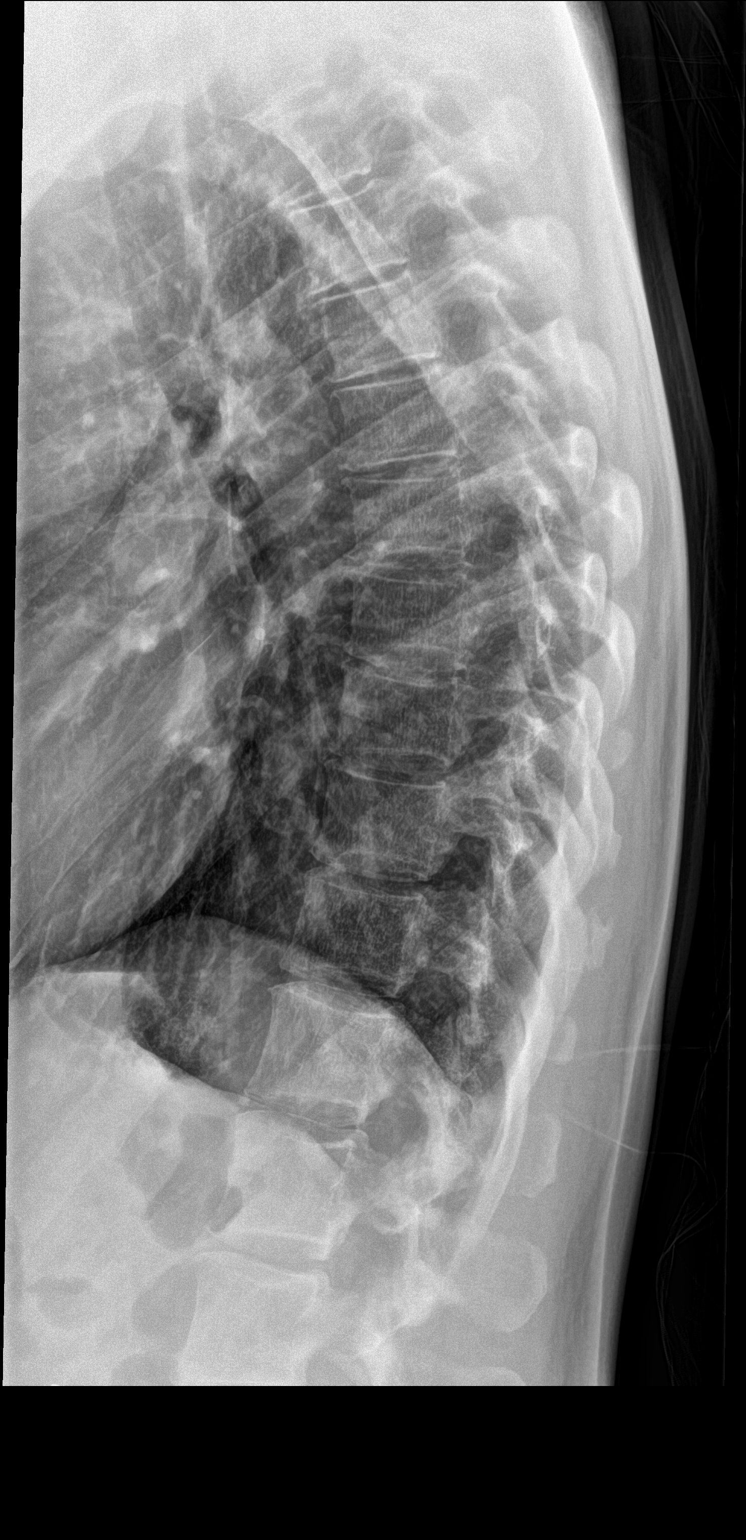

[t-spine swimmers]
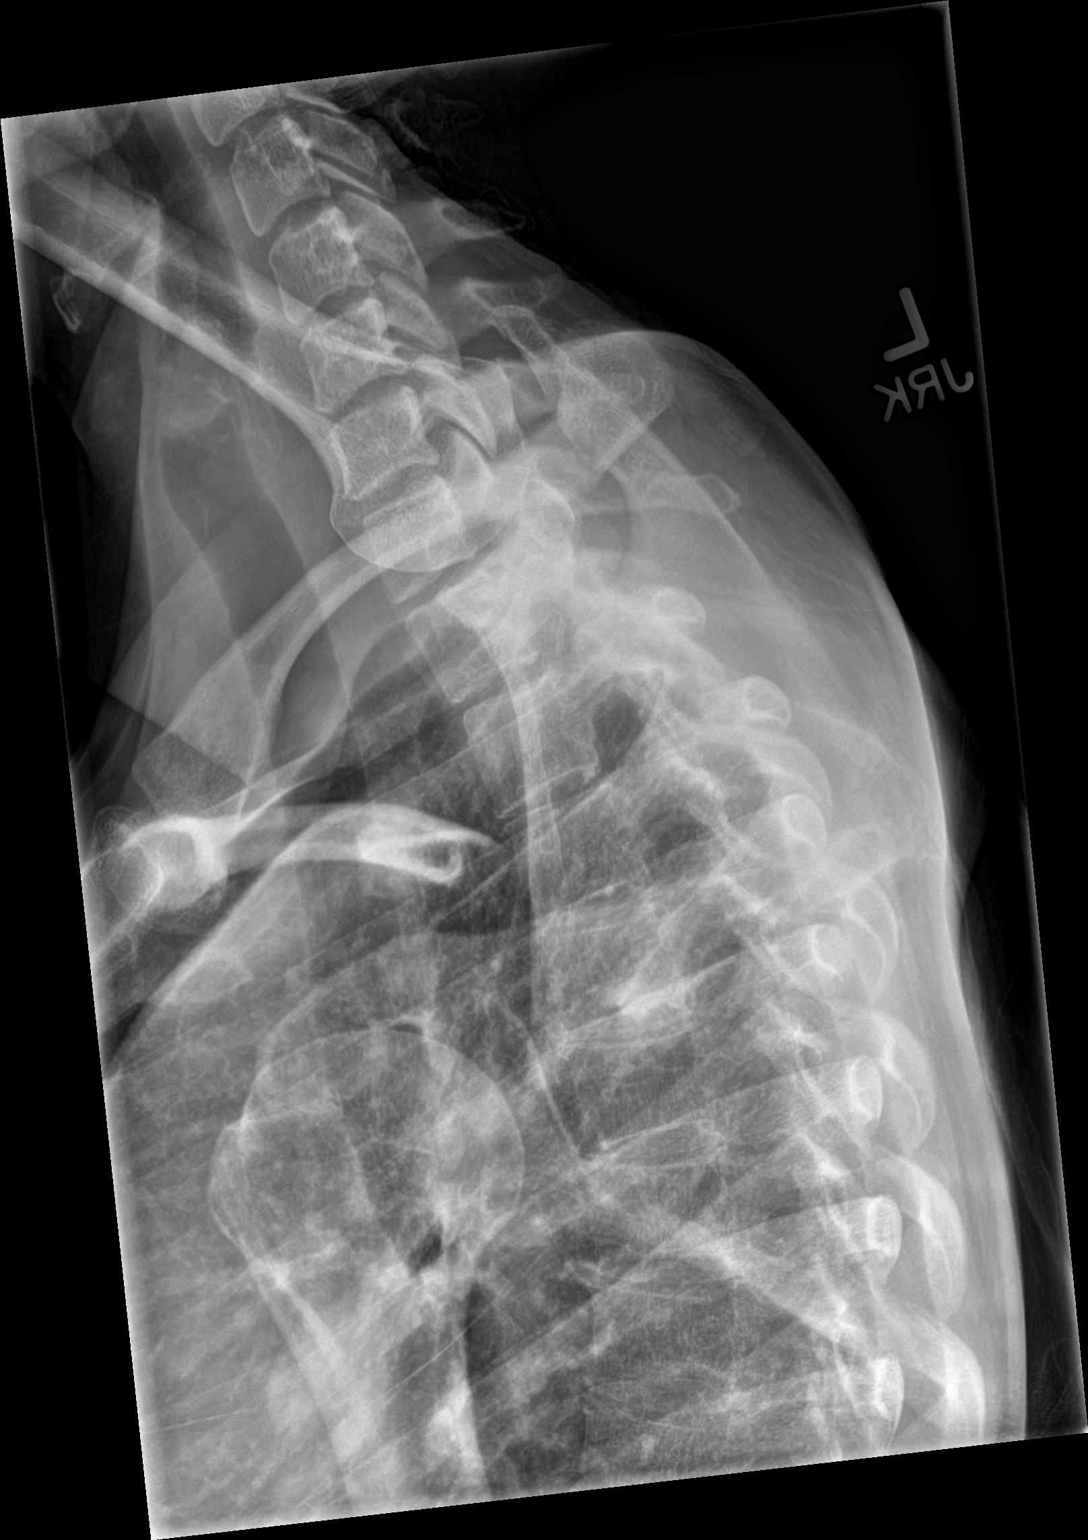

[3 of 3 positions shown; findings below may reference images not displayed]

FINDINGS: Mild curvature of the thoracic spine is convex towards the right.
The vertebral body heights are well preserved.
IMPRESSION: Negative.

## 2016-08-31 ENCOUNTER — Encounter: Payer: Self-pay | Admitting: Obstetrics and Gynecology

## 2016-08-31 ENCOUNTER — Ambulatory Visit (INDEPENDENT_AMBULATORY_CARE_PROVIDER_SITE_OTHER): Payer: Medicaid Other | Admitting: Obstetrics and Gynecology

## 2016-08-31 ENCOUNTER — Ambulatory Visit (INDEPENDENT_AMBULATORY_CARE_PROVIDER_SITE_OTHER): Payer: Medicaid Other

## 2016-08-31 VITALS — BP 110/72 | Ht 65.0 in | Wt 132.0 lb

## 2016-08-31 DIAGNOSIS — N911 Secondary amenorrhea: Secondary | ICD-10-CM | POA: Diagnosis not present

## 2016-08-31 NOTE — Progress Notes (Signed)
Gynecology Ultrasound Follow Up   Chief Complaint  Patient presents with  . Follow-up    u/s follow up  Amenorrhea   History of Present Illness: Patient is a 36 y.o. female who presents today for ultrasound evaluation of secondary amenorrhea. She states that since her last visit she has started to have occasional spotting.  Denies dysmenorrhea at this time.  Ultrasound demonstrates the following findings: Adnexa: multiple follicles/simple cysts (2.5 cm) Uterus: retroverted with endometrial stripe  3.2 - 6.5mm Additional: possible adenomyosis in anterior uterus.   Review of Systems: Review of Systems  Constitutional: Negative.   HENT: Negative.   Eyes: Negative.   Respiratory: Negative.   Cardiovascular: Negative.   Gastrointestinal: Negative.   Genitourinary: Negative.   Musculoskeletal: Negative.   Skin: Negative.   Neurological: Negative.   Psychiatric/Behavioral: Negative.     Past Medical History:  Diagnosis Date  . Anxiety   . Convulsions (HCC)   . HA (headache)   . Hot flashes   . Seizures (HCC)   . Social phobia   . Syncope and collapse     Past Surgical History: No past surgical history on file.  Gynecologic History:  No LMP recorded. Patient is not currently having periods (Reason: Irregular Periods). Contraception: none  Family History: no gynecologic cancer.  Mother with endometriosis.  Social History   Social History  . Marital status: Married    Spouse name: N/A  . Number of children: N/A  . Years of education: N/A   Occupational History  . Not on file.   Social History Main Topics  . Smoking status: Never Smoker  . Smokeless tobacco: Never Used  . Alcohol use No  . Drug use: No  . Sexual activity: Not Currently    Birth control/ protection: Condom   Other Topics Concern  . Not on file   Social History Narrative  . No narrative on file    Allergies  Allergen Reactions  . Neurontin [Gabapentin] Other (See Comments)   Headaches   . Nortriptyline Other (See Comments)  . Lamotrigine Rash  . Levetiracetam Rash    Medications:   Medication Sig Start Date End Date Taking? Authorizing Provider  ALPRAZolam Prudy Feeler) 1 MG tablet Take by mouth. 07/22/16  Yes Historical Provider, MD  clindamycin-benzoyl peroxide (BENZACLIN) gel Apply topically. 06/16/16 06/16/17 Yes Historical Provider, MD  HYDROcodone-acetaminophen (NORCO) 5-325 MG tablet Take 1-2 tablets by mouth every 4 (four) hours as needed for moderate pain. 10/15/15  Yes Charmayne Sheer Beers, PA-C  SUMAtriptan (IMITREX) 100 MG tablet Use one tablet at onset of headache. No more than 2 per day or 6 per week. 04/23/16 04/23/17 Yes Historical Provider, MD  HYDROcodone-acetaminophen (NORCO/VICODIN) 5-325 MG tablet Take by mouth. 07/23/16   Historical Provider, MD   Physical Exam Vitals: Blood pressure 110/72, height  (1.651 m), weight 132 lb (59.9 kg), Body mass index is 21.97 kg/m.   General: NAD HEENT: normocephalic, anicteric Pulmonary: No increased work of breathing Extremities: no edema, erythema, or tenderness Neurologic: Grossly intact, normal gait Psychiatric: mood appropriate, affect full  Assessment: 36 y.o. G1P1002 with secondary amenorrhea, improving and likely will resolve. History of dysmenorrhea with menorrhagia (abnormal uterine bleeding).  Ultrasound findings potentially consistent with adenomyosis.   Plan: Her secondary amenorrhea is likely now resolving as she has had more time since her last dose of Depo-Provera. She is concerned that she will return to having heavy and painful periods. Briefly discussed potential treatments for heavy and painful periods,  which in her case my first recommendation would be a Mirena IUD. Her ultrasound findings are concerning for adenomyosis, which would explain her symptoms. We discussed that I could not positively diagnosed this, though the ultrasound is fairly suggestive. Other modalities to diagnoses would  include an MRI. Discussed that if she did have adenomyosis mainstay of treatment is either hysterectomy or Mirena IUD. She is going to see how it goes with the return of her menstruation and decide which direction she would like to go treatment wise.  Follow up as needed.   CC: DR. Delano Metz [facility] [address] [place], [place] [address]

## 2016-10-04 ENCOUNTER — Telehealth: Payer: Self-pay | Admitting: Obstetrics and Gynecology

## 2016-10-04 NOTE — Telephone Encounter (Signed)
Pt is schedule for 10/29/16 with Jean RosenthalJackson

## 2016-10-04 NOTE — Telephone Encounter (Signed)
[facility]. Please schedule pt with Dr Jean RosenthalJackson when pt calls back to be schedule

## 2016-10-29 ENCOUNTER — Ambulatory Visit (INDEPENDENT_AMBULATORY_CARE_PROVIDER_SITE_OTHER): Payer: Medicaid Other | Admitting: Obstetrics and Gynecology

## 2016-10-29 ENCOUNTER — Encounter: Payer: Self-pay | Admitting: Obstetrics and Gynecology

## 2016-10-29 VITALS — BP 114/70 | Ht 65.0 in | Wt 124.0 lb

## 2016-10-29 DIAGNOSIS — Z3202 Encounter for pregnancy test, result negative: Secondary | ICD-10-CM

## 2016-10-29 DIAGNOSIS — Z30430 Encounter for insertion of intrauterine contraceptive device: Secondary | ICD-10-CM

## 2016-10-29 DIAGNOSIS — N921 Excessive and frequent menstruation with irregular cycle: Secondary | ICD-10-CM

## 2016-10-29 DIAGNOSIS — Z308 Encounter for other contraceptive management: Secondary | ICD-10-CM

## 2016-10-29 NOTE — Progress Notes (Signed)
Obstetrics & Gynecology Office Visit   Chief Complaint  Patient presents with  . Menstrual Problem  . abnormal uterine bleeding  . referral appointment    History of Present Illness: Amenorrhea: Patient seen in follow up from her appointment about 6 weeks ago. She is now having menstrual-like bleeding. She has had bleeding now for a month. She states that she has not been sexually active since September.  She would like to discuss restarting depo provera versus other methods.  She has a history significant for migraine headaches.  She does not smoke, has no liver issues. Has never had cancer.  Is interested also in a Mirena IUD.  Review of Systems: Review of Systems  Constitutional: Negative.   HENT: Negative.   Eyes: Negative.   Respiratory: Negative.   Cardiovascular: Negative.   Gastrointestinal: Negative.   Genitourinary: Negative.   Musculoskeletal: Negative.   Skin: Negative.   Neurological: Negative.   Psychiatric/Behavioral: Negative.     Past Medical History:  Diagnosis Date  . Anxiety   . Convulsions (HCC)   . HA (headache)   . Hot flashes   . Seizures (HCC)   . Social phobia   . Syncope and collapse     Past Surgical History:  Procedure Laterality Date  . CESAREAN SECTION      Gynecologic History: Patient's last menstrual period was 09/27/2016.  Obstetric History: [id number]  History reviewed. No pertinent family history.    Social History   Social History  . Marital status: Married    Spouse name: N/A  . Number of children: N/A  . Years of education: N/A   Occupational History  . Not on file.   Social History Main Topics  . Smoking status: Never Smoker  . Smokeless tobacco: Never Used  . Alcohol use No  . Drug use: No  . Sexual activity: Not Currently    Birth control/ protection: Condom   Other Topics Concern  . Not on file   Social History Narrative  . No narrative on file    Allergies  Allergen Reactions  . Neurontin  [Gabapentin] Other (See Comments)    Headaches   . Nortriptyline Other (See Comments)  . Lamotrigine Rash  . Levetiracetam Rash    Medications:   Medication Sig Start Date End Date Taking? Authorizing Provider  ALPRAZolam Prudy Feeler) 1 MG tablet Take by mouth. 07/22/16  Yes [provider]  clindamycin-benzoyl peroxide (BENZACLIN) gel Apply topically. 06/16/16 06/16/17 Yes [provider]  HYDROcodone-acetaminophen (NORCO/VICODIN) 5-325 MG tablet Take by mouth. 07/23/16  Yes [provider]  SUMAtriptan (IMITREX) 100 MG tablet Use one tablet at onset of headache. No more than 2 per day or 6 per week. 04/23/16 04/23/17 Yes [provider]  HYDROcodone-acetaminophen (NORCO) 5-325 MG tablet Take 1-2 tablets by mouth every 4 (four) hours as needed for moderate pain. Patient not taking: Reported on 10/29/2016 10/15/15   Evangeline Dakin, PA-C    Physical Exam BP 114/70   Ht 5\' 5"  (1.651 m)   Wt 124 lb (56.2 kg)   LMP 09/27/2016   BMI 20.63 kg/m  Patient's last menstrual period was 09/27/2016. Physical Exam  Constitutional: She is oriented to person, place, and time and well-developed, well-nourished, and in no distress. No distress.  HENT:  Head: Normocephalic and atraumatic.  Eyes: Conjunctivae are normal. No scleral icterus.  Neck: Normal range of motion. Neck supple. No thyromegaly present.  Cardiovascular: Normal rate, regular rhythm and normal heart sounds.  Exam reveals  no gallop and no friction rub.   No murmur heard. Pulmonary/Chest: Effort normal and breath sounds normal. No respiratory distress. She has no wheezes. She has no rales.  Abdominal: Soft. Bowel sounds are normal. She exhibits no distension and no mass. There is no tenderness. There is no rebound and no guarding.  Genitourinary: Uterus normal, cervix normal, right adnexa normal, left adnexa normal and vulva normal.  Genitourinary Comments: Uterus is retroflexed  Musculoskeletal: Normal range  of motion. She exhibits no edema.  Lymphadenopathy:    She has no cervical adenopathy.  Neurological: She is alert and oriented to person, place, and time. No cranial nerve deficit.  Skin: Skin is warm and dry. No erythema.  Psychiatric: Mood, affect and judgment normal.   IUD Insertion Procedure Note Patient identified, informed consent performed, consent signed.   Discussed risks of irregular bleeding, cramping, infection, malpositioning, expulsion or uterine perforation of the IUD (1:1000 placements)  which may require further procedure such as laparoscopy.  IUD while effective at preventing pregnancy do not prevent transmission of sexually transmitted diseases and use of barrier methods for this purpose was discussed. Time out was performed.  Urine pregnancy test negative.  Speculum placed in the vagina.  Cervix visualized.  Cleaned with Betadine x 2.  Grasped anteriorly with a single tooth tenaculum.  Uterus sounded to 8 cm. IUD placed per manufacturer's recommendations.  Strings trimmed to 3 cm. Tenaculum was removed, good hemostasis noted.  Patient tolerated procedure well.   Patient was given post-procedure instructions.  She was advised to have backup contraception for one week.  Patient was also asked to check IUD strings periodically and follow up in 4 weeks for IUD check and annual exam, for which she is overdue.    Female chaperone present for pelvic and breast  portions of the physical exam  Assessment: 36 y.o. G1P1002 abnormal uterine bleeding (Menorrhagia with irregular cycles) and desire for contraception.    Plan: Discussed multiple forms of contraception and options regarding management of her abnormal uterine bleeding.  She elects to have an IUD placed, which was done today.  Will see her for an annual exam at the time of her follow up in one month.  Discussed that she could expect some abnormal bleeding for up to 3 months with the Mirena IUD. Discussed all the risks and  benefits of an IUD versus Depo Provera.    Return in about 4 weeks (around 11/26/2016) for Annual exam and IUD String check.   20 minutes spent in face to face discussion with > 50% spent in counseling and management of her menorrhagia with irregular cycles and contraceptive management.   Thomasene MohairStephen Lismary Kiehn, MD 10/30/2016 11:56 AM

## 2016-10-30 DIAGNOSIS — N921 Excessive and frequent menstruation with irregular cycle: Secondary | ICD-10-CM | POA: Insufficient documentation

## 2016-10-30 MED ORDER — LEVONORGESTREL 20 MCG/24HR IU IUD: 1.0000 | INTRAUTERINE_SYSTEM | Freq: Once | INTRAUTERINE | 0 refills | Status: DC

## 2016-10-31 ENCOUNTER — Encounter: Payer: Self-pay | Admitting: Obstetrics and Gynecology

## 2016-11-01 ENCOUNTER — Encounter: Payer: Self-pay | Admitting: Emergency Medicine

## 2016-11-01 ENCOUNTER — Emergency Department
Admission: EM | Admit: 2016-11-01 | Discharge: 2016-11-01 | Disposition: A | Discharge status: Home / Self Care | Payer: Medicaid Other | Attending: Emergency Medicine | Admitting: Emergency Medicine

## 2016-11-01 DIAGNOSIS — Z79899 Other long term (current) drug therapy: Secondary | ICD-10-CM | POA: Insufficient documentation

## 2016-11-01 DIAGNOSIS — R1030 Lower abdominal pain, unspecified: Secondary | ICD-10-CM

## 2016-11-01 DIAGNOSIS — R102 Pelvic and perineal pain: Secondary | ICD-10-CM | POA: Diagnosis present

## 2016-11-01 DIAGNOSIS — R109 Unspecified abdominal pain: Secondary | ICD-10-CM

## 2016-11-01 LAB — COMPREHENSIVE METABOLIC PANEL
ALT: 13 U/L — ABNORMAL LOW (ref 14–54)
ANION GAP: 6 (ref 5–15)
AST: 18 U/L (ref 15–41)
Albumin: 4.5 g/dL (ref 3.5–5.0)
Alkaline Phosphatase: 45 U/L (ref 38–126)
BILIRUBIN TOTAL: 0.6 mg/dL (ref 0.3–1.2)
BUN: 9 mg/dL (ref 6–20)
CHLORIDE: 111 mmol/L (ref 101–111)
CO2: 25 mmol/L (ref 22–32)
Calcium: 9 mg/dL (ref 8.9–10.3)
Creatinine, Ser: 0.56 mg/dL (ref 0.44–1.00)
Glucose, Bld: 99 mg/dL (ref 65–99)
POTASSIUM: 3.5 mmol/L (ref 3.5–5.1)
Sodium: 142 mmol/L (ref 135–145)
TOTAL PROTEIN: 7.1 g/dL (ref 6.5–8.1)

## 2016-11-01 LAB — URINALYSIS, COMPLETE (UACMP) WITH MICROSCOPIC
BACTERIA UA: NONE SEEN
Bilirubin Urine: NEGATIVE
Glucose, UA: NEGATIVE mg/dL
Ketones, ur: NEGATIVE mg/dL
Nitrite: NEGATIVE
PROTEIN: NEGATIVE mg/dL
SPECIFIC GRAVITY, URINE: 1.003 — AB (ref 1.005–1.030)
pH: 6 (ref 5.0–8.0)

## 2016-11-01 LAB — CBC
HEMATOCRIT: 37.9 % (ref 35.0–47.0)
HEMOGLOBIN: 12.4 g/dL (ref 12.0–16.0)
MCH: 28.6 pg (ref 26.0–34.0)
MCHC: 32.8 g/dL (ref 32.0–36.0)
MCV: 87.1 fL (ref 80.0–100.0)
Platelets: 140 10*3/uL — ABNORMAL LOW (ref 150–440)
RBC: 4.35 MIL/uL (ref 3.80–5.20)
RDW: 13.2 % (ref 11.5–14.5)
WBC: 6.7 10*3/uL (ref 3.6–11.0)

## 2016-11-01 LAB — LIPASE, BLOOD: LIPASE: 24 U/L (ref 11–51)

## 2016-11-01 LAB — POCT PREGNANCY, URINE: PREG TEST UR: NEGATIVE

## 2016-11-01 LAB — POCT URINE PREGNANCY: PREG TEST UR: NEGATIVE

## 2016-11-01 MED ORDER — MEDROXYPROGESTERONE ACETATE 150 MG/ML IM SUSP
150.0000 mg | Freq: Once | INTRAMUSCULAR | Status: AC
  Administered 2016-11-01: 150 mg via INTRAMUSCULAR
  Filled 2016-11-01: qty 1

## 2016-11-01 NOTE — Consult Note (Signed)
Obstetrics & Gynecology Consult H&P    Consulting Department: Emergency Department  Consulting Physician: Archie Balboa  Consulting Question: Pain post IUD placement   History of Present Illness: Patient is a 36 y.o. [id number] s/p uncomplicated Mirena IUD placement 10/29/2016 who has experienced abdominal pain and cramping since IUD placement and wishes to have it removed.  Called the office and was told to present to the ED.  She denies fevers, chills, or vaginal discharge.  Has had expected post placement bleeding pattern.  Has not checked for strings.    Review of Systems:10 point review of systems  Past Medical History:  Past Medical History:  Diagnosis Date  . Anxiety   . Convulsions ([place])   . HA (headache)   . Hot flashes   . Seizures ([place])   . Social phobia   . Syncope and collapse     Past Surgical History:  Past Surgical History:  Procedure Laterality Date  . CESAREAN SECTION      Family History:  Family History  Problem Relation Age of Onset  . Anxiety disorder Mother   . Ovarian cancer Mother   . Anxiety disorder Father   . Hypertension Father   . Aortic aneurysm Maternal Aunt   . Lung cancer Maternal Grandmother   . Lung cancer Maternal Grandfather   . Sleep apnea Maternal Grandfather   . Alzheimer's disease Paternal Grandfather   . Multiple sclerosis Paternal Grandfather     Social History:  Social History   Social History  . Marital status: Married    Spouse name: N/A  . Number of children: N/A  . Years of education: N/A   Occupational History  . Not on file.   Social History Main Topics  . Smoking status: Never Smoker  . Smokeless tobacco: Never Used  . Alcohol use No  . Drug use: No  . Sexual activity: Not Currently    Birth control/ protection: Condom   Other Topics Concern  . Not on file   Social History Narrative  . No narrative on file    Allergies:  Allergies  Allergen Reactions  . Amitriptyline Other (See Comments)    unknown   . Neurontin [Gabapentin] Other (See Comments)    Headaches   . Nortriptyline Other (See Comments)  . Lamotrigine Rash  . Levetiracetam Rash    Medications: Prior to Admission medications   Medication Sig Start Date End Date Taking? Authorizing Provider  ALPRAZolam Duanne Moron) 1 MG tablet Take by mouth. 07/22/16   [provider]  clindamycin-benzoyl peroxide (BENZACLIN) gel Apply topically. 06/16/16 06/16/17  [provider]  HYDROcodone-acetaminophen (NORCO) 5-325 MG tablet Take 1-2 tablets by mouth every 4 (four) hours as needed for moderate pain. Patient not taking: Reported on 10/29/2016 10/15/15   Arlyss Repress, PA-C  HYDROcodone-acetaminophen (NORCO/VICODIN) 5-325 MG tablet Take by mouth. 07/23/16   [provider]  levonorgestrel (MIRENA) 20 MCG/24HR IUD 1 Intra Uterine Device (1 each total) by Intrauterine route once. 10/29/16 10/29/16  Will Bonnet, MD  SUMAtriptan (IMITREX) 100 MG tablet Use one tablet at onset of headache. No more than 2 per day or 6 per week. 04/23/16 04/23/17  [provider]    Physical Exam Vitals: Blood pressure (!) 149/86, pulse 76, temperature 98.8 F (37.1 C), temperature source Oral, resp. rate 20, height _0  (1.651 m), weight 123 lb (55.8 kg), SpO2 100 %. General: NAD HEENT: normocephalic, anicteric Pulmonary: No increased work of breathing Abdomen:soft, non-tender, non-distended Genitourinary:normal external female genitalia.  Normal bartholin's and skeened glands, normal urethral meatus.  Normal vaginal mucosa without lesions.  Cervix normal in appearance, IUD strings visualized 3cm in length.  Grasped with ring forceps and removed without difficulty Extremities: no edema, erythema, or tenderness Neurologic: Grossly intact Psychiatric: mood appropriate, affect full  Labs: Results for orders placed or performed during the hospital encounter of 11/01/16 (from the past 72 hour(s))  Lipase, blood     Status: None    Collection Time: 11/01/16 11:09 AM  Result Value Ref Range   Lipase 24 11 - 51 U/L  Comprehensive metabolic panel     Status: Abnormal   Collection Time: 11/01/16 11:09 AM  Result Value Ref Range   Sodium 142 135 - 145 mmol/L   Potassium 3.5 3.5 - 5.1 mmol/L   Chloride 111 101 - 111 mmol/L   CO2 25 22 - 32 mmol/L   Glucose, Bld 99 65 - 99 mg/dL   BUN 9 6 - 20 mg/dL   Creatinine, Ser 0.56 0.44 - 1.00 mg/dL   Calcium 9.0 8.9 - 10.3 mg/dL   Total Protein 7.1 6.5 - 8.1 g/dL   Albumin 4.5 3.5 - 5.0 g/dL   AST 18 15 - 41 U/L   ALT 13 (L) 14 - 54 U/L   Alkaline Phosphatase 45 38 - 126 U/L   Total Bilirubin 0.6 0.3 - 1.2 mg/dL   GFR calc non Af Amer >60 >60 mL/min   GFR calc Af Amer >60 >60 mL/min    Comment: (NOTE) The eGFR has been calculated using the CKD EPI equation. This calculation has not been validated in all clinical situations. eGFR's persistently <60 mL/min signify possible Chronic Kidney Disease.    Anion gap 6 5 - 15  CBC     Status: Abnormal   Collection Time: 11/01/16 11:09 AM  Result Value Ref Range   WBC 6.7 3.6 - 11.0 K/uL   RBC 4.35 3.80 - 5.20 MIL/uL   Hemoglobin 12.4 12.0 - 16.0 g/dL   HCT 37.9 35.0 - 47.0 %   MCV 87.1 80.0 - 100.0 fL   MCH 28.6 26.0 - 34.0 pg   MCHC 32.8 32.0 - 36.0 g/dL   RDW 13.2 11.5 - 14.5 %   Platelets 140 (L) 150 - 440 K/uL  Urinalysis, Complete w Microscopic     Status: Abnormal   Collection Time: 11/01/16  2:19 PM  Result Value Ref Range   Color, Urine STRAW (A) YELLOW   APPearance CLEAR (A) CLEAR   Specific Gravity, Urine 1.003 (L) 1.005 - 1.030   pH 6.0 5.0 - 8.0   Glucose, UA NEGATIVE NEGATIVE mg/dL   Hgb urine dipstick MODERATE (A) NEGATIVE   Bilirubin Urine NEGATIVE NEGATIVE   Ketones, ur NEGATIVE NEGATIVE mg/dL   Protein, ur NEGATIVE NEGATIVE mg/dL   Nitrite NEGATIVE NEGATIVE   Leukocytes, UA TRACE (A) NEGATIVE   RBC / HPF 0-5 0 - 5 RBC/hpf   WBC, UA 0-5 0 - 5 WBC/hpf   Bacteria, UA NONE SEEN NONE SEEN   Squamous  Epithelial / LPF 0-5 (A) NONE SEEN  Pregnancy, urine POC     Status: None   Collection Time: 11/01/16  2:29 PM  Result Value Ref Range   Preg Test, Ur NEGATIVE NEGATIVE    Comment:        THE SENSITIVITY OF THIS METHODOLOGY IS >24 mIU/mL     Imaging No results found.  Assessment: 36 y.o. [id number] with cramping since IUD placement desiring removal  Plan: Reviewed all forms of birth control options available including abstinence; over the counter/barrier methods; hormonal contraceptive medication including pill, patch, ring, injection,contraceptive implant; hormonal and nonhormonal IUDs; permanent sterilization options including vasectomy and the various tubal sterilization modalities. Risks and benefits reviewed.  Questions were answered.  Information was given to patient to review. Patient was previously on depo provera and would like to restart.  Patient opts for depo-provera for contraception.    She understands that Depo-Provera is a progesterone only therapy, and that patients often patients have irregular and unpredictable vaginal bleeding or amenorrhea. She understands that other side effects are possible related to systemic progesterone, including but not limited to, headaches, breast tenderness, nausea, and irritability. While effective at preventing pregnancy Depo-Provera does not prevent transmission of sexually transmitted diseases and use of barrier methods for this purpose was discussed.  She is aware of the need to return every 3 months for re-dosing.  We also discussed the FDA black box warning regarding long term use (greater than 2 years) and bone loss.  Annual scheduled in the next few months.  Malachy Mood, MD

## 2016-11-01 NOTE — Discharge Instructions (Signed)
Please seek medical attention for any high fevers, chest pain, shortness of breath, change in behavior, persistent vomiting, bloody stool or any other new or concerning symptoms.  

## 2016-11-01 NOTE — ED Provider Notes (Signed)
[facility] Emergency Department Provider Note   ____________________________________________   I have reviewed the triage vital signs and the nursing notes.   HISTORY  Chief Complaint Abdominal Pain   History limited by: Not Limited   HPI Morgan [name] is a 36 y.o. female who presents to the emergency department today because of concerns for pelvic pain and cramping. The patient had an IUD placed 3 days ago. She states that since that time she's been having severe pain. It is prevented her from sleeping. She is also noticed some vaginal discharge with bleeding. The patient tried calling her ob/gyn office but was unable to get an appointment today. The patient denies any fevers.   Past Medical History:  Diagnosis Date  . Anxiety   . Convulsions ([facility])   . HA (headache)   . Hot flashes   . Seizures ([facility])   . Social phobia   . Syncope and collapse     Patient Active Problem List   Diagnosis Date Noted  . Menorrhagia with irregular cycle 10/30/2016  . Frequent headaches 08/05/2016  . Hot flashes 08/05/2016  . Social anxiety disorder 08/05/2016  . Acne vulgaris 06/16/2016  . Chronic bilateral thoracic back pain 06/16/2016  . Migraine without aura and without status migrainosus, not intractable 06/16/2016  . Panic disorder with agoraphobia and mild panic attacks 06/16/2016  . Convulsions ([facility]) 04/05/2014  . Spells 04/05/2014  . Anxiety state 02/11/2014  . Anxiety 02/11/2014  . Headache 02/11/2014  . Social phobia 02/11/2014  . Syncope and collapse 02/11/2014  . Syncope 02/11/2014    Past Surgical History:  Procedure Laterality Date  . CESAREAN SECTION      Prior to Admission medications   Medication Sig Start Date End Date Taking? Authorizing Provider  ALPRAZolam Prudy Feeler(XANAX) 1 MG tablet Take by mouth. 07/22/16   [provider]  clindamycin-benzoyl peroxide (BENZACLIN) gel Apply topically. 06/16/16 06/16/17  [provider]   HYDROcodone-acetaminophen (NORCO) 5-325 MG tablet Take 1-2 tablets by mouth every 4 (four) hours as needed for moderate pain. Patient not taking: Reported on 10/29/2016 10/15/15   Evangeline DakinBeers, Charles M, PA-C  HYDROcodone-acetaminophen (NORCO/VICODIN) 5-325 MG tablet Take by mouth. 07/23/16   [provider]  levonorgestrel (MIRENA) 20 MCG/24HR IUD 1 Intra Uterine Device (1 each total) by Intrauterine route once. 10/29/16 10/29/16  Conard NovakJackson, Stephen D, MD  SUMAtriptan (IMITREX) 100 MG tablet Use one tablet at onset of headache. No more than 2 per day or 6 per week. 04/23/16 04/23/17  [provider]    Allergies Amitriptyline; Neurontin [gabapentin]; Nortriptyline; Lamotrigine; and Levetiracetam  Family History  Problem Relation Age of Onset  . Anxiety disorder Mother   . Ovarian cancer Mother   . Anxiety disorder Father   . Hypertension Father   . Aortic aneurysm Maternal Aunt   . Lung cancer Maternal Grandmother   . Lung cancer Maternal Grandfather   . Sleep apnea Maternal Grandfather   . Alzheimer's disease Paternal Grandfather   . Multiple sclerosis Paternal Grandfather     Social History Social History  Substance Use Topics  . Smoking status: Never Smoker  . Smokeless tobacco: Never Used  . Alcohol use No    Review of Systems Constitutional: No fever/chills Eyes: No visual changes. ENT: No sore throat. Cardiovascular: Denies chest pain. Respiratory: Denies shortness of breath. Gastrointestinal: Positive for lower abdominal pain. Genitourinary: Negative for dysuria. Musculoskeletal: Negative for back pain. Skin: Negative for rash. Neurological: Negative for headaches, focal weakness or  numbness.  ____________________________________________   PHYSICAL EXAM:  VITAL SIGNS: ED Triage Vitals  Enc Vitals Group     BP 11/01/16 1109 (!) 149/86     Pulse Rate 11/01/16 1109 76     Resp 11/01/16 1109 20     Temp 11/01/16 1109 98.8 F (37.1 C)     Temp Source  11/01/16 1109 Oral     SpO2 11/01/16 1109 100 %     Weight 11/01/16 1110 123 lb (55.8 kg)     Height 11/01/16 1110 5\' 5"  (1.651 m)     Head Circumference --      Peak Flow --      Pain Score 11/01/16 1109 10   Constitutional: Alert and oriented. Well appearing and in no distress. Eyes: Conjunctivae are normal.  ENT   Head: Normocephalic and atraumatic.   Nose: No congestion/rhinnorhea.   Mouth/Throat: Mucous membranes are moist.   Neck: No stridor. Hematological/Lymphatic/Immunilogical: No cervical lymphadenopathy. Cardiovascular: Normal rate, regular rhythm.  No murmurs, rubs, or gallops.  Respiratory: Normal respiratory effort without tachypnea nor retractions. Breath sounds are clear and equal bilaterally. No wheezes/rales/rhonchi. Gastrointestinal: Soft and non tender. No rebound. No guarding.  Genitourinary: Deferred Musculoskeletal: Normal range of motion in all extremities. No lower extremity edema. Neurologic:  Normal speech and language. No gross focal neurologic deficits are appreciated.  Skin:  Skin is warm, dry and intact. No rash noted. Psychiatric: Mood and affect are normal. Speech and behavior are normal. Patient exhibits appropriate insight and judgment.  ____________________________________________    LABS (pertinent positives/negatives)  Labs Reviewed  COMPREHENSIVE METABOLIC PANEL - Abnormal; Notable for the following:       Result Value   ALT 13 (*)    All other components within normal limits  CBC - Abnormal; Notable for the following:    Platelets 140 (*)    All other components within normal limits  URINALYSIS, COMPLETE (UACMP) WITH MICROSCOPIC - Abnormal; Notable for the following:    Color, Urine STRAW (*)    APPearance CLEAR (*)    Specific Gravity, Urine 1.003 (*)    Hgb urine dipstick MODERATE (*)    Leukocytes, UA TRACE (*)    Squamous Epithelial / LPF 0-5 (*)    All other components within normal limits  LIPASE, BLOOD  POC  URINE PREG, ED  POCT PREGNANCY, URINE     ____________________________________________   EKG  None  ____________________________________________    RADIOLOGY  None  ____________________________________________   PROCEDURES  Procedures  ____________________________________________   INITIAL IMPRESSION / ASSESSMENT AND PLAN / ED COURSE  Pertinent labs & imaging results that were available during my care of the patient were reviewed by me and considered in my medical decision making (see chart for details).  Patient presents with lower abdominal pain in the setting of recent IUD placement. Dr. Bonney Aid with OB/GYN evaluated the patient and removed the IUD. Will give depo shot.   ____________________________________________   FINAL CLINICAL IMPRESSION(S) / ED DIAGNOSES  Final diagnoses:  Abdominal pain, unspecified abdominal location     Note: This dictation was prepared with Dragon dictation. Any transcriptional errors that result from this process are unintentional     Phineas Semen, MD 11/01/16 1515

## 2016-11-01 NOTE — ED Triage Notes (Signed)
Abdominal pain and vag bleed since IUD placed 3 days ago.

## 2016-11-01 NOTE — ED Triage Notes (Signed)
Pt here with c/o lower abdominal pain after IUD insertion on Friday. Pt called [facility], they cannot see her for 2 more days.

## 2016-11-01 NOTE — Addendum Note (Signed)
Addended by: Liliane ShiSHAW, Iaan Oregel M on: 11/01/2016 09:06 AM   Modules accepted: Orders

## 2016-11-01 NOTE — ED Notes (Signed)
Pt had IUD placed on 6/8 and c/o severe lower abd and vaginal pain. Pt ambulatory, denies fever.

## 2016-11-01 NOTE — ED Notes (Signed)
Pharm called at this time to verify depo shot tubed to ED for this pt

## 2016-11-01 NOTE — Progress Notes (Signed)
Ch. Responded to page regarding family member about daughter in ED. Family thought Pt. was not being visited by staff timely. The ED was full and everyone was busy. Advised family that Pt. Would be taken care ASAP once availability found. Bed became available while speaking with staff. Prayed with family. Ask that they [facility] the nurse [facility].

## 2016-11-29 ENCOUNTER — Ambulatory Visit (INDEPENDENT_AMBULATORY_CARE_PROVIDER_SITE_OTHER): Payer: Medicaid Other | Admitting: Obstetrics and Gynecology

## 2016-11-29 ENCOUNTER — Encounter: Payer: Self-pay | Admitting: Obstetrics and Gynecology

## 2016-11-29 VITALS — BP 118/74 | Ht 65.0 in | Wt 124.0 lb

## 2016-11-29 DIAGNOSIS — Z124 Encounter for screening for malignant neoplasm of cervix: Secondary | ICD-10-CM

## 2016-11-29 DIAGNOSIS — Z3042 Encounter for surveillance of injectable contraceptive: Secondary | ICD-10-CM | POA: Diagnosis not present

## 2016-11-29 DIAGNOSIS — Z0000 Encounter for general adult medical examination without abnormal findings: Secondary | ICD-10-CM

## 2016-11-29 DIAGNOSIS — Z01419 Encounter for gynecological examination (general) (routine) without abnormal findings: Secondary | ICD-10-CM

## 2016-11-29 DIAGNOSIS — Z1331 Encounter for screening for depression: Secondary | ICD-10-CM

## 2016-11-29 DIAGNOSIS — Z1339 Encounter for screening examination for other mental health and behavioral disorders: Secondary | ICD-10-CM

## 2016-11-29 DIAGNOSIS — Z1389 Encounter for screening for other disorder: Secondary | ICD-10-CM

## 2016-11-29 NOTE — Progress Notes (Signed)
Gynecology Annual Exam  PCP: [facility], [facility]  Chief Complaint:  Chief Complaint  Patient presents with  . Annual Exam    History of Present Illness:  [name] is a 36 y.o. G1P1002 who LMP was No LMP recorded. Patient has had an injection of Depo Provera.  Presents today for her annual examination.  Her menses are rare.  Dysmenorrhea none. She does not have intermenstrual bleeding. She had an IUD placed 1 month ago and had it removed after several days.  She was given a dose of Depo Provera.  She had her IUD removed in the ER due to severe symptoms.  The IUD did appear to be in place.  But, she chose to have it removed.  She is single partner, contraception - Depo-Provera injections.  Last Pap:   3 years, normal Hx of STDs: none  Last mammogram: n/a There is no FH of breast cancer. There is no FH of ovarian cancer. The patient does not do self-breast exams.  Tobacco use: The patient denies current or previous tobacco use. Alcohol use: none Exercise: moderately active  The patient wears seatbelts: yes.      Past Medical History:  Diagnosis Date  . Anxiety   . Convulsions ([facility])   . HA (headache)   . Hot flashes   . Seizures ([facility])   . Social phobia   . Syncope and collapse     Past Surgical History:  Procedure Laterality Date  . CESAREAN SECTION        Medication Sig Start Date End Date Taking? Authorizing Provider  ALPRAZolam Prudy Feeler(XANAX) 1 MG tablet Take by mouth. 07/22/16  Yes [provider]  clindamycin-benzoyl peroxide (BENZACLIN) gel Apply topically. 06/16/16 06/16/17 Yes [provider]  medroxyPROGESTERone (DEPO-PROVERA) 150 MG/ML injection Inject 150 mg into the muscle every 3 (three) months.   Yes [provider]  SUMAtriptan (IMITREX) 100 MG tablet Use one tablet at onset of headache. No more than 2 per day or 6 per week. 04/23/16 04/23/17 Yes [provider]    Allergies  Allergen Reactions  .  Amitriptyline Other (See Comments)    unknown  . Neurontin [Gabapentin] Other (See Comments)    Headaches   . Nortriptyline Other (See Comments)  . Lamotrigine Rash  . Levetiracetam Rash    Gynecologic History: No LMP recorded. Patient has had an injection.  Obstetric History: [id number]  Social History   Social History  . Marital status: Married    Spouse name: N/A  . Number of children: N/A  . Years of education: N/A   Occupational History  . Not on file.   Social History Main Topics  . Smoking status: Never Smoker  . Smokeless tobacco: Never Used  . Alcohol use No  . Drug use: No  . Sexual activity: Not Currently    Birth control/ protection: Condom   Other Topics Concern  . Not on file   Social History Narrative  . No narrative on file    Family History  Problem Relation Age of Onset  . Anxiety disorder Mother   . Ovarian cancer Mother   . Anxiety disorder Father   . Hypertension Father   . Aortic aneurysm Maternal Aunt   . Lung cancer Maternal Grandmother   . Lung cancer Maternal Grandfather   . Sleep apnea Maternal Grandfather   . Alzheimer's disease Paternal Grandfather   . Multiple sclerosis Paternal Grandfather     Review of Systems  Constitutional:  Negative.   HENT: Negative.   Eyes: Negative.   Respiratory: Negative.   Cardiovascular: Negative.   Gastrointestinal: Negative.   Genitourinary: Negative.   Musculoskeletal: Negative.   Skin: Negative.   Neurological: Negative.   Psychiatric/Behavioral: Negative.      Physical Exam BP 118/74   Ht 5\' 5"  (1.651 m)   Wt 124 lb (56.2 kg)   BMI 20.63 kg/m    Physical Exam  Constitutional: She is oriented to person, place, and time. She appears well-developed and well-nourished. No distress.  Genitourinary: Vagina normal and uterus normal. Pelvic exam was performed with patient supine. There is no rash, tenderness, lesion or injury on the right labia. There is no rash, tenderness, lesion or  injury on the left labia. No erythema, tenderness or bleeding in the vagina. No foreign body in the vagina. No signs of injury around the vagina. No vaginal discharge found. Right adnexum does not display mass, does not display tenderness and does not display fullness. Left adnexum does not display mass, does not display tenderness and does not display fullness. Cervix does not exhibit motion tenderness, discharge, polyp or nabothian cyst.   Uterus is mobile and anteverted. Uterus is not enlarged, tender, exhibiting a mass or irregular (is regular).  HENT:  Head: Normocephalic and atraumatic.  Eyes: EOM are normal. No scleral icterus.  Neck: Normal range of motion. Neck supple. No thyromegaly present.  Cardiovascular: Normal rate and regular rhythm.  Exam reveals no gallop and no friction rub.   No murmur heard. Pulmonary/Chest: Effort normal and breath sounds normal. No respiratory distress. She has no wheezes. She has no rales. Right breast exhibits no inverted nipple, no mass, no nipple discharge, no skin change and no tenderness. Left breast exhibits no inverted nipple, no mass, no nipple discharge, no skin change and no tenderness.  Abdominal: Soft. Bowel sounds are normal. She exhibits no distension and no mass. There is no tenderness. There is no rebound and no guarding.  Musculoskeletal: Normal range of motion. She exhibits no edema.  Lymphadenopathy:    She has no cervical adenopathy.  Neurological: She is alert and oriented to person, place, and time. No cranial nerve deficit.  Skin: Skin is warm and dry. No erythema.  Psychiatric: She has a normal mood and affect. Her behavior is normal. Judgment normal.   Female chaperone present for pelvic and breast  portions of the physical exam  Results: AUDIT Questionnaire (screen for alcoholism): 0 PHQ-9: 0  Assessment: 36 y.o. G99P1002 female here for routine annual gynecologic examination.  Plan: Problem List Items Addressed This Visit     None    Visit Diagnoses    Women's annual routine gynecological examination    -  Primary   Relevant Medications   medroxyPROGESTERone (DEPO-PROVERA) 150 MG/ML injection   Other Relevant Orders   IGP, Aptima HPV, rfx 16/18,45   Pap smear for cervical cancer screening       Relevant Orders   IGP, Aptima HPV, rfx 16/18,45   Screening for alcohol problem       Screening for depression       Encounter for surveillance of injectable contraceptive       Relevant Medications   medroxyPROGESTERone (DEPO-PROVERA) 150 MG/ML injection      Screening: -- Blood pressure screen normal -- Colonoscopy - not due -- Mammogram - not due -- Weight screening: normal -- Depression screening negative (PHQ-9) -- Nutrition: normal -- cholesterol screening: not due for screening -- osteoporosis screening: not  due -- tobacco screening: not using -- alcohol screening: AUDIT questionnaire indicates low-risk usage. -- family history of breast cancer screening: done. not at high risk. -- no evidence of domestic violence or intimate partner violence. -- STD screening: gonorrhea/chlamydia NAAT not collected per patient request. -- pap smear collected per ASCCP guidelines -- HPV vaccination series: not eligilbe  Thomasene Mohair, MD 12/01/2016 12:04 PM

## 2016-12-01 MED ORDER — MEDROXYPROGESTERONE ACETATE 150 MG/ML IM SUSP: 150.0000 mg | INTRAMUSCULAR | 4 refills | Status: DC

## 2016-12-09 LAB — IGP, APTIMA HPV, RFX 16/18,45
HPV Aptima: POSITIVE — AB
HPV GENOTYPE 16: NEGATIVE
HPV GENOTYPE 18,45: NEGATIVE
PAP Smear Comment: 0

## 2016-12-14 ENCOUNTER — Encounter: Payer: Self-pay | Admitting: Obstetrics and Gynecology

## 2017-01-25 ENCOUNTER — Ambulatory Visit (INDEPENDENT_AMBULATORY_CARE_PROVIDER_SITE_OTHER): Payer: Medicaid Other

## 2017-01-25 DIAGNOSIS — Z3042 Encounter for surveillance of injectable contraceptive: Secondary | ICD-10-CM

## 2017-01-27 MED ORDER — MEDROXYPROGESTERONE ACETATE 150 MG/ML IM SUSP
150.0000 mg | Freq: Once | INTRAMUSCULAR | Status: AC
  Administered 2017-01-25: 150 mg via INTRAMUSCULAR

## 2017-04-26 ENCOUNTER — Ambulatory Visit (INDEPENDENT_AMBULATORY_CARE_PROVIDER_SITE_OTHER): Payer: Medicaid Other

## 2017-04-26 DIAGNOSIS — Z3042 Encounter for surveillance of injectable contraceptive: Secondary | ICD-10-CM | POA: Diagnosis not present

## 2017-04-26 MED ORDER — MEDROXYPROGESTERONE ACETATE 150 MG/ML IM SUSP
150.0000 mg | Freq: Once | INTRAMUSCULAR | Status: AC
  Administered 2017-04-26: 150 mg via INTRAMUSCULAR

## 2017-06-27 ENCOUNTER — Ambulatory Visit: Payer: Medicaid Other | Admitting: Obstetrics and Gynecology

## 2017-07-20 ENCOUNTER — Ambulatory Visit: Payer: Medicaid Other

## 2018-08-03 ENCOUNTER — Emergency Department (HOSPITAL_COMMUNITY)
Admission: EM | Admit: 2018-08-03 | Discharge: 2018-08-04 | Disposition: A | Discharge status: Home / Self Care | Payer: Medicaid Other | Attending: Emergency Medicine | Admitting: Emergency Medicine

## 2018-08-03 ENCOUNTER — Encounter (HOSPITAL_COMMUNITY): Payer: Self-pay | Admitting: Emergency Medicine

## 2018-08-03 ENCOUNTER — Other Ambulatory Visit: Payer: Self-pay

## 2018-08-03 DIAGNOSIS — R21 Rash and other nonspecific skin eruption: Secondary | ICD-10-CM | POA: Diagnosis present

## 2018-08-03 DIAGNOSIS — L309 Dermatitis, unspecified: Secondary | ICD-10-CM | POA: Insufficient documentation

## 2018-08-03 DIAGNOSIS — Z79899 Other long term (current) drug therapy: Secondary | ICD-10-CM | POA: Insufficient documentation

## 2018-08-03 NOTE — ED Triage Notes (Signed)
Pt presents with rash to entire body present X1 week that has continued to spread. Has been seen at [facility] for same, was given steroid shot and Rx for prednisone but has seen no improvement. States she was exposed to poison oak/ivy?

## 2018-08-04 LAB — CBC
HCT: 40 % (ref 36.0–46.0)
HEMOGLOBIN: 12.7 g/dL (ref 12.0–15.0)
MCH: 27.6 pg (ref 26.0–34.0)
MCHC: 31.8 g/dL (ref 30.0–36.0)
MCV: 87 fL (ref 80.0–100.0)
Platelets: 190 10*3/uL (ref 150–400)
RBC: 4.6 MIL/uL (ref 3.87–5.11)
RDW: 13 % (ref 11.5–15.5)
WBC: 8.7 10*3/uL (ref 4.0–10.5)
nRBC: 0 % (ref 0.0–0.2)

## 2018-08-04 LAB — PROTIME-INR
INR: 1 (ref 0.8–1.2)
PROTHROMBIN TIME: 12.7 s (ref 11.4–15.2)

## 2018-08-04 MED ORDER — METHYLPREDNISOLONE 4 MG PO TBPK: ORAL_TABLET | ORAL | 0 refills | Status: DC

## 2018-08-04 NOTE — Discharge Instructions (Signed)
We recommend that you start a Medrol Dosepak and take as prescribed until finished.  Continue use of other topical agents for symptom management.  You may also take daily Zyrtec or Claritin for management of itching.  Follow-up with your primary care doctor to ensure symptoms resolve.  You may also benefit from dermatology follow-up.

## 2018-08-04 NOTE — ED Provider Notes (Signed)
[facility] EMERGENCY DEPARTMENT Provider Note   CSN: [id number] Arrival date & time: 08/03/18  2203    History   Chief Complaint Chief Complaint  Patient presents with  . Rash    HPI Morgan [name] is a 38 y.o. female.    38 year old female presents to the emergency department for evaluation of a rash.  She initially noticed the rash on Friday after doing yard work.  Felt that she may have been exposed to poison oak or poison sumac and went to urgent care on Friday evening.  Was given a shot of dexamethasone which she felt largely improved the rash on Saturday morning.  She started prednisone Saturday morning, but continued to notice worsening of her symptoms with spreading of the rash to her trunk and lower extremities.  Was seen back at urgent care on Tuesday, told to discontinue prednisone use.  She was given a topical ointment to use instead.  She has been using this in addition to over-the-counter remedies for management.  Expresses concern over purplish discoloration to the rash on her legs.  She has no fevers, difficulty breathing, or difficulty swallowing.  No contact with persons with similar rash.  Does state that the presence of the rash has aggravated her anxiety some.  The history is provided by the patient. No language interpreter was used.  Rash    Past Medical History:  Diagnosis Date  . Anxiety   . Convulsions ([place])   . HA (headache)   . Hot flashes   . Seizures ([place])   . Social phobia   . Syncope and collapse     Patient Active Problem List   Diagnosis Date Noted  . Menorrhagia with irregular cycle 10/30/2016  . Frequent headaches 08/05/2016  . Hot flashes 08/05/2016  . Social anxiety disorder 08/05/2016  . Acne vulgaris 06/16/2016  . Chronic bilateral thoracic back pain 06/16/2016  . Migraine without aura and without status migrainosus, not intractable 06/16/2016  . 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 Convulsions ([place]) 04/05/2014  . Spells 04/05/2014  . Anxiety state 02/11/2014  . Anxiety 02/11/2014  . Headache 02/11/2014  . Social phobia 02/11/2014  . Syncope and collapse 02/11/2014  . Syncope 02/11/2014    Past Surgical History:  Procedure Laterality Date  . CESAREAN SECTION       OB History    Gravida  1   Para  1   Term  1   Preterm      AB      Living  2     SAB      TAB      Ectopic      Multiple  1   Live Births  2            Home Medications    Prior to Admission medications   Medication Sig Start Date End Date Taking? Authorizing Provider  ALPRAZolam Duanne Moron) 1 MG tablet Take by mouth. 07/22/16   [provider]  HYDROcodone-acetaminophen (NORCO/VICODIN) 5-325 MG tablet Take by mouth. 07/23/16   [provider]  medroxyPROGESTERone (DEPO-PROVERA) 150 MG/ML injection Inject 1 mL (150 mg total) into the muscle every 3 (three) months. 12/01/16   Will Bonnet, MD  methylPREDNISolone (MEDROL) 4 MG TBPK tablet Take 6 tabs on day 1, 5 tabs on day 2, 4 tabs on day 3, 3 tabs on day 4, 2 tabs on day 5, and 1 tab on day 6.  08/04/18   Antonietta Breach, PA-C  SUMAtriptan (IMITREX) 100 MG tablet Use one tablet at onset of headache. No more than 2 per day or 6 per week. 04/23/16 04/23/17  [provider]    Family History Family History  Problem Relation Age of Onset  . Anxiety disorder Mother   . Ovarian cancer Mother   . Anxiety disorder Father   . Hypertension Father   . Aortic aneurysm Maternal Aunt   . Lung cancer Maternal Grandmother   . Lung cancer Maternal Grandfather   . Sleep apnea Maternal Grandfather   . Alzheimer's disease Paternal Grandfather   . Multiple sclerosis Paternal Grandfather     Social History Social History   Tobacco Use  . Smoking status: Never Smoker  . Smokeless tobacco: Never Used  Substance Use Topics  . Alcohol use: No  . Drug use: No     Allergies   Amitriptyline; Neurontin  [gabapentin]; Nortriptyline; Prednisone; Lamotrigine; and Levetiracetam   Review of Systems Review of Systems  Skin: Positive for rash.  Ten systems reviewed and are negative for acute change, except as noted in the HPI.    Physical Exam Updated Vital Signs BP 121/89   Pulse 88   Temp 98.7 F (37.1 C) (Oral)   Resp 18   Ht 5\' 5"  (1.651 m)   Wt 60.3 kg   SpO2 100%   BMI 22.13 kg/m   Physical Exam Vitals signs and nursing note reviewed.  Constitutional:      General: She is not in acute distress.    Appearance: She is well-developed. She is not diaphoretic.     Comments: Nontoxic appearing and in NAD  HENT:     Head: Normocephalic and atraumatic.     Mouth/Throat:     Comments: No angioedema. Tolerating secretions without difficulty. Eyes:     General: No scleral icterus.    Conjunctiva/sclera: Conjunctivae normal.  Neck:     Musculoskeletal: Normal range of motion.  Pulmonary:     Effort: Pulmonary effort is normal. No respiratory distress.     Comments: Respirations even and unlabored Musculoskeletal: Normal range of motion.  Skin:    General: Skin is warm and dry.     Coloration: Skin is not pale.     Findings: Rash present.     Comments: Maculopapular, pruritic, blanching erythematous rash scattered to trunk and extremities. Mild purpuric appearance to posterior BLE. No induration, skin sloughing.  Neurological:     Mental Status: She is alert and oriented to person, place, and time.  Psychiatric:        Mood and Affect: Mood is anxious.        Behavior: Behavior normal.             ED Treatments / Results  Labs (all labs ordered are listed, but only abnormal results are displayed) Labs Reviewed  CBC  PROTIME-INR    EKG None  Radiology No results found.  Procedures Procedures (including critical care time)  Medications Ordered in ED Medications - No data to display   2:30 AM Labs reviewed. Normal H/H and platelet count. Normal  PT/INR.   Initial Impression / Assessment and Plan / ED Course  I have reviewed the triage vital signs and the nursing notes.  Pertinent labs & imaging results that were available during my care of the patient were reviewed by me and considered in my medical decision making (see chart for details).        38 year old  female presenting for erythematous, blanching, pruritic rash which began on her hands and forearms 6 days ago, spreading to her bilateral flank and posterior lower extremities.  Suspected secondary to contact with poison oak or sumac after performing yardwork.  Rash is palm sparing; soles not assessed. It is not c/w sporotrichosis, SJS, erythema multiforme.  Patient reporting some initial improvement with Decadron IM, but was told to d/c prednisone on Tuesday for concern of allergic reaction.  Symptoms do not appear c/w urticaria.  Plan to start back on Medrol dose pack and continue with topical agents. Referral given to dermatology.  Return precautions discussed and provided. Patient discharged in stable condition with no unaddressed concerns.   Final Clinical Impressions(s) / ED Diagnoses   Final diagnoses:  Dermatitis    ED Discharge Orders         Ordered    methylPREDNISolone (MEDROL) 4 MG TBPK tablet     08/04/18 0235           Antonietta Breach, PA-C 08/04/18 0558    Palumbo, April, MD 08/04/18 [phone]

## 2018-08-04 NOTE — ED Notes (Signed)
Patient Alert and oriented to baseline. Stable and ambulatory to baseline. Patient verbalized understanding of the discharge instructions.  Patient belongings were taken by the patient.   

## 2019-03-26 ENCOUNTER — Ambulatory Visit: Payer: Medicaid Other | Admitting: Obstetrics and Gynecology

## 2019-04-12 ENCOUNTER — Ambulatory Visit: Payer: Medicaid Other | Admitting: Obstetrics and Gynecology

## 2019-11-27 ENCOUNTER — Ambulatory Visit: Payer: Medicaid Other

## 2019-11-27 ENCOUNTER — Other Ambulatory Visit: Payer: Self-pay

## 2019-11-27 ENCOUNTER — Encounter: Payer: Self-pay | Admitting: Cardiology

## 2019-11-27 ENCOUNTER — Ambulatory Visit: Payer: Medicaid Other | Admitting: Cardiology

## 2019-11-27 VITALS — BP 160/60 | HR 99 | Resp 16 | Ht 65.0 in | Wt 150.0 lb

## 2019-11-27 DIAGNOSIS — R55 Syncope and collapse: Secondary | ICD-10-CM

## 2019-11-27 DIAGNOSIS — R002 Palpitations: Secondary | ICD-10-CM

## 2019-11-28 ENCOUNTER — Encounter: Payer: Self-pay | Admitting: Cardiology

## 2019-11-28 NOTE — Progress Notes (Signed)
Primary Physician/Referring:  Vivien Presto, MD  Patient ID: Morgan [name], female    DOB: 01-22-1981, 39 y.o.   MRN: [id number]  Chief Complaint  Patient presents with  . New Patient (Initial Visit)  . LOOP RECORDER SYNCOPE   HPI:    Morgan [name]  is a 39 y.o. Caucasian female with fibromyalgia, chronic pain disorder, panic disorder with agoraphobia and history of migraine and recurrent episodes of syncope presents to establish care.  She was evaluated at [facility] cardiology and could not wear an event monitor as she developed severe skin rash.  State was discussed regarding placement of loop recorder and she wanted a second opinion and presents to establish care.  History dates back to several years sometime in 2019 that she started having episodes of syncope and thinks she may be having seizures.  Every episode has premonitory symptoms of feeling dizzy, tunnel vision followed by falling to the ground, she still aware of the surroundings but unable to move.  No other associated symptoms.  No incontinence.  Most symptoms occurred while she is sitting or standing and never in bed or when she is laying down.  She has occasional episodes of palpitations associated with this.  Past Medical History:  Diagnosis Date  . Anxiety   . Convulsions ([facility])   . HA (headache)   . Hot flashes   . Seizures ([facility])   . Social phobia   . Syncope and collapse    Past Surgical History:  Procedure Laterality Date  . CESAREAN SECTION     Family History  Problem Relation Age of Onset  . Anxiety disorder Mother   . Ovarian cancer Mother   . Anxiety disorder Father   . Hypertension Father   . Aortic aneurysm Maternal Aunt   . Lung cancer Maternal Grandmother   . Lung cancer Maternal Grandfather   . Sleep apnea Maternal Grandfather   . Alzheimer's disease Paternal Grandfather   . Multiple sclerosis Paternal Grandfather     Social History   Tobacco Use  . Smoking status: Never [name]  .  Smokeless tobacco: Never Used  Substance Use Topics  . Alcohol use: No   Marital Status: Legally Separated   ROS  Review of Systems  Cardiovascular: Positive for chest pain, dyspnea on exertion, near-syncope, palpitations and syncope. Negative for leg swelling.  Musculoskeletal: Positive for myalgias.  Gastrointestinal: Negative for melena.   Objective  Blood pressure (!) 160/60, pulse 99, resp. rate 16, height 5\' 5"  (1.651 m), weight 150 lb (68 kg), SpO2 100 %.  Vitals with BMI 11/27/2019 08/04/2018 08/03/2018  Height 5\' 5"  - 5\' 5"   Weight 150 lbs - 133 lbs  BMI 24.96 - 22.13  Systolic 160 121 10/03/2018  Diastolic 60 89 88  Pulse 99 88 97    Orthostatic VS for the past 72 hrs (Last 3 readings):  Orthostatic BP Patient Position BP Location Cuff Size Orthostatic Pulse  11/27/19 1631 120/70 Standing Left Arm Normal 96  11/27/19 1630 138/60 Sitting Left Arm Normal 100  11/27/19 1629 140/62 Supine Left Arm Normal 99     Physical Exam Cardiovascular:     Rate and Rhythm: Normal rate and regular rhythm.     Pulses: Intact distal pulses.     Heart sounds: Normal heart sounds. No murmur heard.  No gallop.      Comments: No leg edema, no JVD. Pulmonary:     Effort: Pulmonary effort is normal.     Breath  sounds: Normal breath sounds.  Abdominal:     General: Bowel sounds are normal.     Palpations: Abdomen is soft.    Laboratory examination:   No results for input(s): NA, K, CL, CO2, GLUCOSE, BUN, CREATININE, CALCIUM, GFRNONAA, GFRAA in the last 8760 hours. CrCl cannot be calculated (Patient's most recent lab result is older than the maximum 21 days allowed.).  CMP Latest Ref Rng & Units 11/01/2016  Glucose 65 - 99 mg/dL 99  BUN 6 - 20 mg/dL 9  Creatinine 5.36 - 4.68 mg/dL 0.32  Sodium 122 - 482 mmol/L 142  Potassium 3.5 - 5.1 mmol/L 3.5  Chloride 101 - 111 mmol/L 111  CO2 22 - 32 mmol/L 25  Calcium 8.9 - 10.3 mg/dL 9.0  Total Protein 6.5 - 8.1 g/dL 7.1  Total Bilirubin 0.3 - 1.2  mg/dL 0.6  Alkaline Phos 38 - 126 U/L 45  AST 15 - 41 U/L 18  ALT 14 - 54 U/L 13(L)   CBC Latest Ref Rng & Units 08/04/2018 11/01/2016  WBC 4.0 - 10.5 K/uL 8.7 6.7  Hemoglobin 12.0 - 15.0 g/dL 50.0 37.0  Hematocrit 36 - 46 % 40.0 37.9  Platelets 150 - 400 K/uL 190 140(L)    External labs:   Cholesterol, Total 178 07/13/2018  HDL 70 07/13/2018  LDL 85 48/88/9169  Triglycerides 117 07/13/2018   Hemoglobin 14.0 06/14/2019  Platelet Count 220 06/14/2019   Creatinine 0.90 06/14/2019  BUN 4 (L) 06/14/2019  Sodium 139 06/14/2019  Potassium 3.7 06/14/2019  ALT (SGPT) 10 06/14/2019  AST 17 06/14/2019           TSH 2.430 07/13/2018    Medications and allergies   Allergies  Allergen Reactions  . Amitriptyline Other (See Comments)    unknown  . Neurontin [Gabapentin] Other (See Comments)    Headaches   . Nortriptyline Other (See Comments)  . Prednisone   . Lamotrigine Rash  . Levetiracetam Rash     Current Outpatient Medications  Medication Instructions  . ALPRAZolam (XANAX) 1 MG tablet Oral  . cyclobenzaprine (FLEXERIL) 10 MG tablet 1 tablet, Oral, Daily  . SUMAtriptan (IMITREX) 100 MG tablet Use one tablet at onset of headache. No more than 2 per day or 6 per week.    Radiology:   No results found.  Cardiac Studies:   None EKG  NSR 11/08/2019 at [facility]    Assessment     ICD-10-CM   1. Vasovagal syncope  R55 PCV ECHOCARDIOGRAM COMPLETE    LONG TERM MONITOR (3-14 DAYS)  2. Palpitations  R00.2 PCV ECHOCARDIOGRAM COMPLETE    LONG TERM MONITOR (3-14 DAYS)     Medications Discontinued During This Encounter  Medication Reason  . HYDROcodone-acetaminophen (NORCO/VICODIN) 5-325 MG tablet Patient Preference  . medroxyPROGESTERone (DEPO-PROVERA) 150 MG/ML injection Patient Preference  . methylPREDNISolone (MEDROL) 4 MG TBPK tablet Completed Course     Recommendations:   Morgan [name]  is a 39 y.o. Caucasian female with fibromyalgia, chronic pain disorder,  panic disorder with agoraphobia and history of migraine and recurrent episodes of syncope presents to establish care.  She was evaluated at [facility] cardiology and could not wear an event monitor as she developed severe skin rash.  State was discussed regarding placement of loop recorder and she wanted a second opinion and presents to establish care.  Patient symptoms are clearly suggestive of vasovagal syncope.  She also has mild orthostatic changes today but I am not very sure whether she has primary  autonomic insufficiency with orthostasis.  I will recheck this on her next office visit.  In fact her blood pressure was elevated when she initially came in.  I reviewed her external records, reviewed her labs, all the labs including renal function, hepatic function and TSH are within normal limits.  EKG per report is within normal limits.  We will perform an echocardiogram and also will do extended EKG monitoring for 2 weeks, I would like to see her back following this.  Counter pressure maneuver discussed with the patient and to keep herself well-hydrated.  I will emperically try Vit B1 (Thiamine) 50 mg, Vit B6 (Pyrodoxine) 50 mg and Vit B12 (rappid release) , twice daily for palpitations and tachycardia. Consider Fish oil capsule daily with food.  60 min encounter to review external labs, records and coordination of care.    Yates Decamp, MD, [facility] 11/28/2019, 7:55 AM Office: [phone]

## 2019-11-28 NOTE — Telephone Encounter (Signed)
From patient.

## 2019-11-29 NOTE — Telephone Encounter (Signed)
From patient.

## 2019-12-04 ENCOUNTER — Ambulatory Visit: Payer: Medicaid Other

## 2019-12-04 ENCOUNTER — Other Ambulatory Visit: Payer: Self-pay

## 2019-12-04 DIAGNOSIS — R55 Syncope and collapse: Secondary | ICD-10-CM

## 2019-12-04 DIAGNOSIS — R002 Palpitations: Secondary | ICD-10-CM

## 2019-12-27 ENCOUNTER — Ambulatory Visit: Payer: Medicaid Other | Admitting: Cardiology

## 2020-01-18 ENCOUNTER — Other Ambulatory Visit: Payer: Self-pay

## 2020-01-18 ENCOUNTER — Encounter: Payer: Self-pay | Admitting: Obstetrics and Gynecology

## 2020-01-18 ENCOUNTER — Ambulatory Visit (INDEPENDENT_AMBULATORY_CARE_PROVIDER_SITE_OTHER): Payer: Medicaid Other | Admitting: Obstetrics and Gynecology

## 2020-01-18 ENCOUNTER — Other Ambulatory Visit (HOSPITAL_COMMUNITY)
Admission: RE | Admit: 2020-01-18 | Discharge: 2020-01-18 | Disposition: A | Discharge status: Home / Self Care | Payer: Medicaid Other | Source: Ambulatory Visit | Attending: Obstetrics and Gynecology | Admitting: Obstetrics and Gynecology

## 2020-01-18 VITALS — BP 110/80 | Ht 65.0 in | Wt 150.0 lb

## 2020-01-18 DIAGNOSIS — Z113 Encounter for screening for infections with a predominantly sexual mode of transmission: Secondary | ICD-10-CM

## 2020-01-18 DIAGNOSIS — Z124 Encounter for screening for malignant neoplasm of cervix: Secondary | ICD-10-CM | POA: Insufficient documentation

## 2020-01-18 DIAGNOSIS — Z1339 Encounter for screening examination for other mental health and behavioral disorders: Secondary | ICD-10-CM

## 2020-01-18 DIAGNOSIS — Z01419 Encounter for gynecological examination (general) (routine) without abnormal findings: Secondary | ICD-10-CM | POA: Insufficient documentation

## 2020-01-18 DIAGNOSIS — Z1331 Encounter for screening for depression: Secondary | ICD-10-CM | POA: Diagnosis not present

## 2020-01-18 NOTE — Progress Notes (Signed)
Gynecology Annual Exam  PCP: Vivien Presto, MD  Chief Complaint  Patient presents with  . Gynecologic Exam    no concerns   History of Present Illness:  [name] is a 39 y.o. G1P1002 who LMP was Patient's last menstrual period was 12/19/2019 (approximate)., presents today for her annual examination.  Her menses are regular every 28-30 days, lasting 4 day(s).  Dysmenorrhea mild, occurring first 1-2 days of flow. She does not have intermenstrual bleeding.  She is sexually active. She takes Tri-Previfem.  Last Pap: 3 years ago  Results were: no abnormalities /neg HPV DNA positive (HPV 16/18 negative).  Hx of STDs: none  There is no FH of breast cancer. There is a of ovarian cancer in her mother at a young age. The patient does do self-breast exams.  She notes soreness in her breasts and a lump during her menses. She also notes an increase in size of her breasts.  Tobacco use: The patient denies current or previous tobacco use. Alcohol use: social drinker Exercise: yes  The patient wears seatbelts: yes.   The patient reports that domestic violence in her life is absent.   Past Medical History:  Diagnosis Date  . Anxiety   . Convulsions ([facility])   . Fibromyalgia   . HA (headache)   . Hot flashes   . PTSD (post-traumatic stress disorder)   . Rheumatoid arthritis ([facility])   . Seizures ([facility])   . Social phobia   . Syncope and collapse     Past Surgical History:  Procedure Laterality Date  . CESAREAN SECTION      Prior to Admission medications   Medication Sig Start Date End Date Taking? Authorizing Provider  ALPRAZolam Prudy Feeler) 1 MG tablet Take by mouth. 07/22/16  Yes [provider]  cyclobenzaprine (FLEXERIL) 10 MG tablet Take 1 tablet by mouth daily. 11/15/19  Yes [provider]  SUMAtriptan (IMITREX) 100 MG tablet Use one tablet at onset of headache. No more than 2 per day or 6 per week. 04/23/16 11/27/19  [provider]    Allergies   Allergen Reactions  . Amitriptyline Other (See Comments)    unknown  . Neurontin [Gabapentin] Other (See Comments)    Headaches   . Nortriptyline Other (See Comments)  . Prednisone   . Lamotrigine Rash  . Levetiracetam Rash   Obstetric History: [id number]  Social History   Socioeconomic History  . Marital status: Legally Separated    Spouse name: Not on file  . Number of children: 2  . Years of education: Not on file  . Highest education level: Not on file  Occupational History  . Not on file  Tobacco Use  . Smoking status: Never Smoker  . Smokeless tobacco: Never Used  Vaping Use  . Vaping Use: Never used  Substance and Sexual Activity  . Alcohol use: No  . Drug use: Yes    Types: Marijuana  . Sexual activity: Yes    Birth control/protection: Condom, Pill  Other Topics Concern  . Not on file  Social History Narrative  . Not on file   Social Determinants of Health   Financial Resource Strain:   . Difficulty of Paying Living Expenses: Not on file  Food Insecurity:   . Worried About Programme researcher, broadcasting/film/video in the Last Year: Not on file  . Ran Out of Food in the Last Year: Not on file  Transportation Needs:   . Lack of Transportation (Medical): Not on  file  . Lack of Transportation (Non-Medical): Not on file  Physical Activity:   . Days of Exercise per Week: Not on file  . Minutes of Exercise per Session: Not on file  Stress:   . Feeling of Stress : Not on file  Social Connections:   . Frequency of Communication with Friends and Family: Not on file  . Frequency of Social Gatherings with Friends and Family: Not on file  . Attends Religious Services: Not on file  . Active Member of Clubs or Organizations: Not on file  . Attends Banker Meetings: Not on file  . Marital Status: Not on file  Intimate Partner Violence:   . Fear of Current or Ex-Partner: Not on file  . Emotionally Abused: Not on file  . Physically Abused: Not on file  . Sexually Abused:  Not on file    Family History  Problem Relation Age of Onset  . Anxiety disorder Mother   . Ovarian cancer Mother   . Anxiety disorder Father   . Hypertension Father   . Aortic aneurysm Maternal Aunt   . Lung cancer Maternal Grandmother   . Lung cancer Maternal Grandfather   . Sleep apnea Maternal Grandfather   . Alzheimer's disease Paternal Grandfather   . Multiple sclerosis Paternal Grandfather     Review of Systems  Constitutional: Negative.   HENT: Negative.   Eyes: Negative.   Respiratory: Negative.   Cardiovascular: Negative.   Gastrointestinal: Negative.   Genitourinary: Negative.   Musculoskeletal: Negative.   Skin: Negative.   Neurological: Negative.   Psychiatric/Behavioral: Negative.      Physical Exam BP 110/80   Ht 5\' 5"  (1.651 m)   Wt 150 lb (68 kg)   LMP 12/19/2019 (Approximate)   BMI 24.96 kg/m    Physical Exam Constitutional:      General: She is not in acute distress.    Appearance: Normal appearance. She is well-developed.  Genitourinary:     Pelvic exam was performed with patient in the lithotomy position.     Vulva, urethra, bladder and uterus normal.     No inguinal adenopathy present in the right or left side.    No signs of injury in the vagina.     No vaginal discharge, erythema, tenderness or bleeding.     No cervical motion tenderness, discharge, lesion or polyp.     Uterus is mobile.     Uterus is not enlarged or tender.     No uterine mass detected.    Uterus is anteverted.     No right or left adnexal mass present.     Right adnexa not tender or full.     Left adnexa not tender or full.  HENT:     Head: Normocephalic and atraumatic.  Eyes:     General: No scleral icterus.    Conjunctiva/sclera: Conjunctivae normal.  Neck:     Thyroid: No thyromegaly.  Cardiovascular:     Rate and Rhythm: Normal rate and regular rhythm.     Heart sounds: No murmur heard.  No friction rub. No gallop.   Pulmonary:     Effort: Pulmonary  effort is normal. No respiratory distress.     Breath sounds: Normal breath sounds. No wheezing or rales.  Chest:     Breasts:        Right: No inverted nipple, mass, nipple discharge, skin change or tenderness.        Left: No inverted nipple, mass, nipple discharge,  skin change or tenderness.  Abdominal:     General: Bowel sounds are normal. There is no distension.     Palpations: Abdomen is soft. There is no mass.     Tenderness: There is no abdominal tenderness. There is no guarding or rebound.  Musculoskeletal:        General: No swelling or tenderness. Normal range of motion.     Cervical back: Normal range of motion and neck supple.  Lymphadenopathy:     Cervical: No cervical adenopathy.     Lower Body: No right inguinal adenopathy. No left inguinal adenopathy.  Neurological:     General: No focal deficit present.     Mental Status: She is alert and oriented to person, place, and time.     Cranial Nerves: No cranial nerve deficit.  Skin:    General: Skin is warm and dry.     Findings: No erythema or rash.  Psychiatric:        Mood and Affect: Mood normal.        Behavior: Behavior normal.        Judgment: Judgment normal.   Female chaperone present for pelvic and breast  portions of the physical exam  Results: AUDIT Questionnaire (screen for alcoholism): 1 PHQ-9: 2   Assessment: 39 y.o. G78P1002 female here for routine annual gynecologic examination  Plan: Problem List Items Addressed This Visit    None    Visit Diagnoses    Women's annual routine gynecological examination    -  Primary   Relevant Orders   Cytology - PAP   Screening for depression       Screening for alcoholism       Pap smear for cervical cancer screening       Relevant Orders   Cytology - PAP   Screen for STD (sexually transmitted disease)       Relevant Orders   Cytology - PAP      Screening: -- Blood pressure screen normal -- Weight screening: normal -- Depression screening  negative (PHQ-9) -- Nutrition: normal -- cholesterol screening: not due for screening -- osteoporosis screening: not due -- tobacco screening: not using -- alcohol screening: AUDIT questionnaire indicates low-risk usage. -- family history of breast cancer screening: done. not at high risk. -- no evidence of domestic violence or intimate partner violence. -- STD screening: gonorrhea/chlamydia NAAT collected -- pap smear collected per ASCCP guidelines  Contraceptive management: Discussed migraines.  She refers to symptoms that she believes could be aura.  However, she is unsure and states that they do not occur with each headache she gets.  We discussed that birth control containing estrogen is contraindicated in migraine with aura.  I encouraged her to discuss this with her neurologist and if, after discussing symptoms, her neurologist does believe she has symptoms consistent with aura, we will have to utilize another form of contraception.  She was given samples of Slynd to last a few months.  If she does not have aura, will refill her Tri-Previfem medication.     Thomasene Mohair, MD 01/18/2020 1:41 PM

## 2020-01-19 ENCOUNTER — Encounter: Payer: Self-pay | Admitting: Obstetrics and Gynecology

## 2020-01-21 ENCOUNTER — Other Ambulatory Visit: Payer: Self-pay

## 2020-01-21 ENCOUNTER — Encounter: Payer: Self-pay | Admitting: Cardiology

## 2020-01-21 ENCOUNTER — Ambulatory Visit: Payer: Medicaid Other | Admitting: Cardiology

## 2020-01-21 VITALS — BP 124/77 | HR 96 | Resp 16 | Ht 65.0 in | Wt 147.0 lb

## 2020-01-21 DIAGNOSIS — R55 Syncope and collapse: Secondary | ICD-10-CM

## 2020-01-21 DIAGNOSIS — R002 Palpitations: Secondary | ICD-10-CM

## 2020-01-21 NOTE — Progress Notes (Signed)
Primary Physician/Referring:  Vivien Presto, MD  Patient ID: Morgan [name], female    DOB: 1981-05-03, 39 y.o.   MRN: [id number]  Chief Complaint  Patient presents with  . Loss of Consciousness  . Follow-up    4 week   HPI:    Morgan [name]  is a 39 y.o. Caucasian female with fibromyalgia, chronic pain disorder, panic disorder with agoraphobia and history of migraine and recurrent episodes of syncope presents to establish care.  She was evaluated at [facility] cardiology and could not wear an event monitor as she developed severe skin rash.  State was discussed regarding placement of loop recorder and she wanted a second opinion and presents to establish care.  History dates back to several years sometime in 2019 that she started having episodes of syncope and thinks she may be having seizures.  Every episode has premonitory symptoms of feeling dizzy, tunnel vision followed by falling to the ground, she still aware of the surroundings but unable to move.  No other associated symptoms.  No incontinence.  Most symptoms occurred while she is sitting or standing and never in bed or when she is laying down.  She has occasional episodes of palpitations associated with this.  She underwent echocardiogram and also extended outpatient telemetry and presents for follow-up.  Unfortunately she did not wear Zio patch for greater than a day.  States that she had another episode of syncope about couple days ago while she was sitting and felt dizzy and felt the episode coming on and had to lay down but there was no frank syncope.  Past Medical History:  Diagnosis Date  . Anxiety   . Convulsions (HCC)   . Fibromyalgia   . HA (headache)   . Hot flashes   . PTSD (post-traumatic stress disorder)   . Rheumatoid arthritis (HCC)   . Seizures (HCC)   . Social phobia   . Syncope and collapse    Past Surgical History:  Procedure Laterality Date  . CESAREAN SECTION     Family History  Problem  Relation Age of Onset  . Anxiety disorder Mother   . Ovarian cancer Mother   . Anxiety disorder Father   . Hypertension Father   . Aortic aneurysm Maternal Aunt   . Lung cancer Maternal Grandmother   . Lung cancer Maternal Grandfather   . Sleep apnea Maternal Grandfather   . Alzheimer's disease Paternal Grandfather   . Multiple sclerosis Paternal Grandfather     Social History   Tobacco Use  . Smoking status: Never [name]  . Smokeless tobacco: Never Used  Substance Use Topics  . Alcohol use: Yes    Comment: rare   Marital Status: Legally Separated   ROS  Review of Systems  Cardiovascular: Positive for chest pain, dyspnea on exertion, near-syncope, palpitations and syncope. Negative for leg swelling.  Musculoskeletal: Positive for myalgias.  Gastrointestinal: Negative for melena.   Objective  Blood pressure 124/77, pulse 96, resp. rate 16, height 5\' 5"  (1.651 m), weight 147 lb (66.7 kg), SpO2 100 %.  Vitals with BMI 01/21/2020 01/18/2020 11/27/2019  Height 5\' 5"  5\' 5"  5\' 5"   Weight 147 lbs 150 lbs 150 lbs  BMI 24.46 24.96 24.96  Systolic 124 110 01/28/2020  Diastolic 77 80 60  Pulse 96 - 99    Orthostatic VS for the past 72 hrs (Last 3 readings):  Orthostatic BP Patient Position BP Location Cuff Size Orthostatic Pulse  01/21/20 1343 (!) 140/91 Standing Left  Arm Normal 98  01/21/20 1342 (!) 149/107 Sitting Left Arm Normal 91  01/21/20 1341 122/78 Supine Left Arm Normal 93     Physical Exam Cardiovascular:     Rate and Rhythm: Normal rate and regular rhythm.     Pulses: Intact distal pulses.     Heart sounds: Normal heart sounds. No murmur heard.  No gallop.      Comments: No leg edema, no JVD. Pulmonary:     Effort: Pulmonary effort is normal.     Breath sounds: Normal breath sounds.  Abdominal:     General: Bowel sounds are normal.     Palpations: Abdomen is soft.    Laboratory examination:   No results for input(s): NA, K, CL, CO2, GLUCOSE, BUN, CREATININE, CALCIUM,  GFRNONAA, GFRAA in the last 8760 hours. CrCl cannot be calculated (Patient's most recent lab result is older than the maximum 21 days allowed.).  CMP Latest Ref Rng & Units 11/01/2016  Glucose 65 - 99 mg/dL 99  BUN 6 - 20 mg/dL 9  Creatinine 4.40 - 3.47 mg/dL 4.25  Sodium 956 - 387 mmol/L 142  Potassium 3.5 - 5.1 mmol/L 3.5  Chloride 101 - 111 mmol/L 111  CO2 22 - 32 mmol/L 25  Calcium 8.9 - 10.3 mg/dL 9.0  Total Protein 6.5 - 8.1 g/dL 7.1  Total Bilirubin 0.3 - 1.2 mg/dL 0.6  Alkaline Phos 38 - 126 U/L 45  AST 15 - 41 U/L 18  ALT 14 - 54 U/L 13(L)   CBC Latest Ref Rng & Units 08/04/2018 11/01/2016  WBC 4.0 - 10.5 K/uL 8.7 6.7  Hemoglobin 12.0 - 15.0 g/dL 56.4 33.2  Hematocrit 36 - 46 % 40.0 37.9  Platelets 150 - 400 K/uL 190 140(L)    External labs:   Cholesterol, Total 178 07/13/2018  HDL 70 07/13/2018  LDL 85 95/18/8416  Triglycerides 117 07/13/2018   Hemoglobin 14.0 06/14/2019  Platelet Count 220 06/14/2019   Creatinine 0.90 06/14/2019  BUN 4 (L) 06/14/2019  Sodium 139 06/14/2019  Potassium 3.7 06/14/2019  ALT (SGPT) 10 06/14/2019  AST 17 06/14/2019           TSH 2.430 07/13/2018    Medications and allergies   Allergies  Allergen Reactions  . Amitriptyline Other (See Comments)    unknown  . Neurontin [Gabapentin] Other (See Comments)    Headaches   . Nortriptyline Other (See Comments)  . Prednisone   . Lamotrigine Rash  . Levetiracetam Rash     Current Outpatient Medications  Medication Instructions  . ALPRAZolam (XANAX) 1 MG tablet Oral  . cyclobenzaprine (FLEXERIL) 10 MG tablet 1 tablet, Oral, Daily  . Naproxen Sodium (ALEVE PO) 500 mg, Oral, Daily  . SUMAtriptan (IMITREX) 100 MG tablet Use one tablet at onset of headache. No more than 2 per day or 6 per week.    Radiology:   No results found.  Cardiac Studies:   Zio patch Long-term monitor 7 hours on 11/27/2019: Predominant rhythm is normal sinus rhythm.  There were rare PACs and PVCs.   There were no patient triggered activity. Patient was recommended to wear for 2 weeks.   Echocardiogram 12/04/2019:  Left ventricle cavity is normal in size and wall thickness. Normal global  wall motion. Normal LV systolic function with visual EF 50-55%. Normal diastolic filling pattern.  No significant valvular abnormality.  Normal right atrial pressure.  EKG  EKG 01/21/2020: Normal sinus rhythm with rate of 88 bpm, incomplete right bundle branch block.  Normal EKG.    NSR 11/08/2019 at [facility]    Assessment     ICD-10-CM   1. Vasovagal syncope  R55   2. Syncope and collapse  R55 EKG 12-Lead     There are no discontinued medications.   Recommendations:   Morgan [name]  is a 39 y.o. Caucasian female with fibromyalgia, chronic pain disorder, panic disorder with agoraphobia and history of migraine and recurrent episodes of syncope presents to establish care.  She was evaluated at [facility] cardiology and could not wear an event monitor as she developed severe skin rash.  State was discussed regarding placement of loop recorder and she wanted a second opinion and presents to establish care.  Patient symptoms are clearly suggestive of vasovagal syncope.  I have reviewed the echocardiogram essentially normal.  She does not wear event monitor for greater than a day but that did not show any significant events.  She is now being evaluated for estrogen issues that she is on replacement now and this may also be contributing to her presentation as well.  Unless her symptoms are recurrent and persistent, loop recorder would be the only option.  Fortunately all her symptoms have premonitory symptoms and hence preventive strategy discussed in detail.  In the interim I have recommended herself to keep well-hydrated especially with Pedialyte at least once or twice a day. Counter pressure maneuver discussed with the patien.   Patient appears to be reassured, I will see her back on a as needed  basis.   Yates Decamp, MD, [facility] 01/21/2020, 2:43 PM Office: [phone]

## 2020-01-24 LAB — CYTOLOGY - PAP
Chlamydia: NEGATIVE
Comment: NEGATIVE
Comment: NEGATIVE
Comment: NEGATIVE
Comment: NEGATIVE
Comment: NORMAL
Diagnosis: UNDETERMINED — AB
HPV 16: NEGATIVE
HPV 18 / 45: NEGATIVE
High risk HPV: POSITIVE — AB
Neisseria Gonorrhea: NEGATIVE
Trichomonas: NEGATIVE

## 2020-02-07 ENCOUNTER — Telehealth: Payer: Self-pay | Admitting: Obstetrics and Gynecology

## 2020-02-07 NOTE — Telephone Encounter (Signed)
Patient is scheduled for 03/18/20 with SDJ

## 2020-02-07 NOTE — Telephone Encounter (Signed)
-----   Message from Kimberly [place], [place] sent at 02/07/2020  1:44 PM EDT ----- Pt is aware of results. Please call pt and schedule Colposcopy with SDJ ----- Message ----- From: Conard Novak, MD Sent: 02/07/2020   1:37 PM EDT To: Kimberly [place], CMA  Please call patient and let her know that her pap smear was abnormal. Due to this, she needs to have a colposcopy in the next couple of weeks to take a closer look at her cervix.  Please let me know, if she has questions. Thanks!

## 2020-03-18 ENCOUNTER — Other Ambulatory Visit: Payer: Self-pay

## 2020-03-18 ENCOUNTER — Other Ambulatory Visit (HOSPITAL_COMMUNITY)
Admission: RE | Admit: 2020-03-18 | Discharge: 2020-03-18 | Disposition: A | Discharge status: Home / Self Care | Payer: Medicaid Other | Source: Ambulatory Visit | Attending: Obstetrics and Gynecology | Admitting: Obstetrics and Gynecology

## 2020-03-18 ENCOUNTER — Ambulatory Visit (INDEPENDENT_AMBULATORY_CARE_PROVIDER_SITE_OTHER): Payer: Medicaid Other | Admitting: Obstetrics and Gynecology

## 2020-03-18 ENCOUNTER — Encounter: Payer: Self-pay | Admitting: Obstetrics and Gynecology

## 2020-03-18 VITALS — BP 112/80 | Ht 65.0 in | Wt 150.0 lb

## 2020-03-18 DIAGNOSIS — R87810 Cervical high risk human papillomavirus (HPV) DNA test positive: Secondary | ICD-10-CM | POA: Diagnosis not present

## 2020-03-18 DIAGNOSIS — R87610 Atypical squamous cells of undetermined significance on cytologic smear of cervix (ASC-US): Secondary | ICD-10-CM | POA: Diagnosis present

## 2020-03-18 NOTE — Progress Notes (Signed)
HPI:  Morgan [name] is a 39 y.o.  G1P1002  who presents today for evaluation and management of abnormal cervical cytology.    Dysplasia History:   12/2019 - ASCUS, HPV+ 2018 - NILM, HPV+(16/18 neg)   OB History  Gravida Para Term Preterm AB Living  1 1 1     2   SAB TAB Ectopic Multiple Live Births        1 2    # Outcome Date GA Lbr Len/2nd Weight Sex Delivery Anes PTL Lv  1A Term 08/21/06 [redacted]w[redacted]d  4 lb 11 oz (2.126 kg) M CS-LTranv   LIV  1B Term 08/21/06   4 lb 11 oz (2.126 kg) M CS-LTranv   LIV    Past Medical History:  Diagnosis Date  . Anxiety   . Convulsions (HCC)   . Fibromyalgia   . HA (headache)   . Hot flashes   . PTSD (post-traumatic stress disorder)   . Rheumatoid arthritis (HCC)   . Seizures (HCC)   . Social phobia   . Syncope and collapse     Past Surgical History:  Procedure Laterality Date  . CESAREAN SECTION      SOCIAL HISTORY:  Social History   Substance and Sexual Activity  Alcohol Use Yes   Comment: rare    Social History   Substance and Sexual Activity  Drug Use Yes  . Types: Marijuana     Family History  Problem Relation Age of Onset  . Anxiety disorder Mother   . Ovarian cancer Mother   . Anxiety disorder Father   . Hypertension Father   . Aortic aneurysm Maternal Aunt   . Lung cancer Maternal Grandmother   . Lung cancer Maternal Grandfather   . Sleep apnea Maternal Grandfather   . Alzheimer's disease Paternal Grandfather   . Multiple sclerosis Paternal Grandfather     ALLERGIES:  Latex, Amitriptyline, Neurontin [gabapentin], Nortriptyline, Prednisone, Lamotrigine, and Levetiracetam  Current Outpatient Medications on File Prior to Visit  Medication Sig Dispense Refill  . ALPRAZolam (XANAX) 1 MG tablet Take by mouth.    . cyclobenzaprine (FLEXERIL) 10 MG tablet Take 1 tablet by mouth daily.    . Naproxen Sodium (ALEVE PO) Take 500 mg by mouth daily.    . SUMAtriptan (IMITREX) 100 MG tablet Use one tablet at onset of  headache. No more than 2 per day or 6 per week.     No current facility-administered medications on file prior to visit.    Physical Exam: -Vitals:  BP 112/80   Ht 5\' 5"  (1.651 m)   Wt 150 lb (68 kg)   LMP 02/07/2020 (Exact Date)   BMI 24.96 kg/m  GEN: WD, WN, NAD.  A+ O x 3, good mood and affect. ABD:  NT, ND.  Soft, no masses.  No hernias noted.   Pelvic:   Vulva: Normal appearance.  No lesions.  Vagina: No lesions or abnormalities noted.  Support: Normal pelvic support.  Urethra No masses tenderness or scarring.  Meatus Normal size without lesions or prolapse.  Cervix: See below.  Anus: Normal exam.  No lesions.  Perineum: Normal exam.  No lesions.        Bimanual   Uterus: Normal size.  Non-tender.  Mobile.  AV.  Adnexae: No masses.  Non-tender to palpation.  Cul-de-sac: Negative for abnormality.   PROCEDURE: 1.  Urine Pregnancy Test:  negative 2.  Colposcopy performed with 4% acetic acid after verbal consent obtained                                         -  Aceto-white Lesions Location(s): mostly at 4-5 o'clock.              -Biopsy performed at 4 and 12 o'clock               -ECC indicated and performed: Yes.       -Biopsy sites made hemostatic with pressure, AgNO3, and/or Monsel's solution   -Satisfactory colposcopy: No.    -Evidence of Invasive cervical CA :  NO  ASSESSMENT:  Morgan [name] is a 39 y.o. G1P1002 here for  1. ASCUS with positive high risk HPV cervical   .  PLAN: I discussed the grading system of pap smears and HPV high risk viral types.  We will discuss and base management after colpo results return.     Morgan Mohair, MD  [facility], [facility] 03/18/2020  3:47 PM

## 2020-03-20 ENCOUNTER — Encounter: Payer: Self-pay | Admitting: Obstetrics and Gynecology

## 2020-03-20 LAB — SURGICAL PATHOLOGY

## 2020-06-10 ENCOUNTER — Other Ambulatory Visit: Payer: Self-pay | Admitting: Obstetrics and Gynecology

## 2020-06-10 DIAGNOSIS — Z30011 Encounter for initial prescription of contraceptive pills: Secondary | ICD-10-CM

## 2020-06-10 MED ORDER — SLYND 4 MG PO TABS: 1.0000 | ORAL_TABLET | Freq: Every day | ORAL | 3 refills | Status: DC

## 2021-01-20 ENCOUNTER — Encounter: Payer: Self-pay | Admitting: Obstetrics and Gynecology

## 2021-01-20 ENCOUNTER — Ambulatory Visit (INDEPENDENT_AMBULATORY_CARE_PROVIDER_SITE_OTHER): Payer: Medicaid Other | Admitting: Obstetrics and Gynecology

## 2021-01-20 ENCOUNTER — Other Ambulatory Visit: Payer: Self-pay

## 2021-01-20 ENCOUNTER — Other Ambulatory Visit (HOSPITAL_COMMUNITY)
Admission: RE | Admit: 2021-01-20 | Discharge: 2021-01-20 | Disposition: A | Discharge status: Home / Self Care | Payer: Medicaid Other | Source: Ambulatory Visit | Attending: Obstetrics and Gynecology | Admitting: Obstetrics and Gynecology

## 2021-01-20 VITALS — BP 126/84 | Ht 64.0 in | Wt 150.0 lb

## 2021-01-20 DIAGNOSIS — R87610 Atypical squamous cells of undetermined significance on cytologic smear of cervix (ASC-US): Secondary | ICD-10-CM

## 2021-01-20 DIAGNOSIS — Z1339 Encounter for screening examination for other mental health and behavioral disorders: Secondary | ICD-10-CM | POA: Diagnosis not present

## 2021-01-20 DIAGNOSIS — Z113 Encounter for screening for infections with a predominantly sexual mode of transmission: Secondary | ICD-10-CM | POA: Insufficient documentation

## 2021-01-20 DIAGNOSIS — Z1331 Encounter for screening for depression: Secondary | ICD-10-CM

## 2021-01-20 DIAGNOSIS — Z01419 Encounter for gynecological examination (general) (routine) without abnormal findings: Secondary | ICD-10-CM | POA: Diagnosis not present

## 2021-01-20 DIAGNOSIS — R87810 Cervical high risk human papillomavirus (HPV) DNA test positive: Secondary | ICD-10-CM | POA: Diagnosis present

## 2021-01-20 DIAGNOSIS — Z30011 Encounter for initial prescription of contraceptive pills: Secondary | ICD-10-CM

## 2021-01-20 DIAGNOSIS — Z3041 Encounter for surveillance of contraceptive pills: Secondary | ICD-10-CM

## 2021-01-20 MED ORDER — SLYND 4 MG PO TABS: 1.0000 | ORAL_TABLET | Freq: Every day | ORAL | 3 refills | Status: AC

## 2021-01-20 NOTE — Progress Notes (Signed)
Gynecology Annual Exam  PCP: Corrington, Meredith Mody, MD  Chief Complaint  Patient presents with   Annual Exam   Menstrual Problem    Pt having BTB on OCP's   History of Present Illness:  [name] is a 40 y.o. G1P1002 who LMP was No LMP recorded. (Menstrual status: Oral contraceptives)., presents today for her annual examination.  She has bleeding when she starts taking the new pack of pills instead of during the 4 days.  The bleeding is usually light and she has bad cramping.  The bleeding lasts 2-3 days and then stops.  When this first started in April, she stopped the pills and restarted.  Since then the bleeding has been consistently happening lasting a day or two.    She is sexually active. She takes Slynd Last Pap:  12/2019 - ASCUS, HPV+, colposcopy 02/2020 - CIN 1 2018 - NILM, HPV+(16/18/ neg) Hx of STDs: none  There is no FH of breast cancer. There is a of ovarian cancer in her mother at a young age. The patient does do self-breast exams.  .  Tobacco use: The patient denies current or previous tobacco use. Alcohol use: social drinker Exercise: yes  The patient wears seatbelts: yes.   The patient reports that domestic violence in her life is absent.   Past Medical History:  Diagnosis Date   Anxiety    Convulsions (HCC)    Fibromyalgia    HA (headache)    Hot flashes    PTSD (post-traumatic stress disorder)    Rheumatoid arthritis (HCC)    Seizures (HCC)    Social phobia    Syncope and collapse     Past Surgical History:  Procedure Laterality Date   CESAREAN SECTION      Prior to Admission medications   Medication Sig Start Date End Date Taking? Authorizing Provider  ALPRAZolam Prudy Feeler) 1 MG tablet Take by mouth. 07/22/16  Yes [provider]  cyclobenzaprine (FLEXERIL) 10 MG tablet Take 1 tablet by mouth daily. 11/15/19  Yes [provider]  SUMAtriptan (IMITREX) 100 MG tablet Use one tablet at onset of headache. No more than 2 per day or 6  per week. 04/23/16 11/27/19  [provider]    Allergies  Allergen Reactions   Latex Itching, Rash and Other (See Comments)   Amitriptyline Other (See Comments)    unknown   Neurontin [Gabapentin] Other (See Comments)    Headaches    Nortriptyline Other (See Comments)   Prednisone    Lamotrigine Rash   Levetiracetam Rash   Obstetric History: [id number]  Social History   Socioeconomic History   Marital status: Unknown    Spouse name: Not on file   Number of children: 2   Years of education: Not on file   Highest education level: Not on file  Occupational History   Not on file  Tobacco Use   Smoking status: Never   Smokeless tobacco: Never  Vaping Use   Vaping Use: Never used  Substance and Sexual Activity   Alcohol use: Yes    Comment: rare   Drug use: Yes    Types: Marijuana   Sexual activity: Not Currently    Birth control/protection: Condom, Pill  Other Topics Concern   Not on file  Social History Narrative   Not on file   Social Determinants of Health   Financial Resource Strain: Not on file  Food Insecurity: Not on file  Transportation Needs: Not on file  Physical  Activity: Not on file  Stress: Not on file  Social Connections: Not on file  Intimate Partner Violence: Not on file    Family History  Problem Relation Age of Onset   Anxiety disorder Mother    Ovarian cancer Mother    Anxiety disorder Father    Hypertension Father    Aortic aneurysm Maternal Aunt    Lung cancer Maternal Grandmother    Lung cancer Maternal Grandfather    Sleep apnea Maternal Grandfather    Alzheimer's disease Paternal Grandfather    Multiple sclerosis Paternal Grandfather     Review of Systems  Constitutional: Negative.   HENT: Negative.    Eyes: Negative.        +change in vision, difficulty with reading things close up  Respiratory: Negative.    Cardiovascular: Negative.   Gastrointestinal: Negative.   Genitourinary: Negative.   Musculoskeletal:  Negative.   Skin: Negative.   Neurological:  Positive for tingling and headaches. Negative for dizziness, tremors, sensory change, speech change, focal weakness, seizures, loss of consciousness and weakness.  Psychiatric/Behavioral:  Negative for depression, hallucinations, memory loss, substance abuse and suicidal ideas. The patient is nervous/anxious. The patient does not have insomnia.     Physical Exam BP 126/84   Ht 5\' 4"  (1.626 m)   Wt 150 lb (68 kg)   BMI 25.75 kg/m    Physical Exam Constitutional:      General: She is not in acute distress.    Appearance: Normal appearance. She is well-developed.  Genitourinary:     Vulva and bladder normal.     No vaginal discharge, erythema, tenderness or bleeding.      Right Adnexa: not tender, not full and no mass present.    Left Adnexa: not tender, not full and no mass present.    No cervical motion tenderness, discharge, lesion or polyp.     Uterus is not enlarged or tender.     No uterine mass detected.    Pelvic exam was performed with patient in the lithotomy position.  Breasts:    Right: No inverted nipple, mass, nipple discharge, skin change or tenderness.     Left: No inverted nipple, mass, nipple discharge, skin change or tenderness.  HENT:     Head: Normocephalic and atraumatic.  Eyes:     General: No scleral icterus.    Conjunctiva/sclera: Conjunctivae normal.  Neck:     Thyroid: No thyromegaly.  Cardiovascular:     Rate and Rhythm: Normal rate and regular rhythm.     Heart sounds: No murmur heard.   No friction rub. No gallop.  Pulmonary:     Effort: Pulmonary effort is normal. No respiratory distress.     Breath sounds: Normal breath sounds. No wheezing or rales.  Abdominal:     General: Bowel sounds are normal. There is no distension.     Palpations: Abdomen is soft. There is no mass.     Tenderness: There is no abdominal tenderness. There is no guarding or rebound.  Musculoskeletal:        General: No  swelling or tenderness. Normal range of motion.     Cervical back: Normal range of motion and neck supple.  Lymphadenopathy:     Cervical: No cervical adenopathy.     Lower Body: No right inguinal adenopathy. No left inguinal adenopathy.  Neurological:     General: No focal deficit present.     Mental Status: She is alert and oriented to person, place, and time.  Cranial Nerves: No cranial nerve deficit.  Skin:    General: Skin is warm and dry.     Findings: No erythema or rash.  Psychiatric:        Mood and Affect: Mood normal.        Behavior: Behavior normal.        Judgment: Judgment normal.  Female chaperone present for pelvic and breast  portions of the physical exam  Results: AUDIT Questionnaire (screen for alcoholism): 3 PHQ-9: 4   Assessment: 40 y.o. G58P1002 female here for routine annual gynecologic examination  Plan: Problem List Items Addressed This Visit   None Visit Diagnoses     Women's annual routine gynecological examination    -  Primary   Relevant Medications   Drospirenone (SLYND) 4 MG TABS   Other Relevant Orders   Cytology - PAP   Cervicovaginal ancillary only   Screening for depression       Screening for alcoholism       ASCUS with positive high risk HPV cervical       Relevant Orders   Cytology - PAP   Screen for STD (sexually transmitted disease)       Relevant Orders   Cervicovaginal ancillary only   Encounter for surveillance of contraceptive pills       Relevant Medications   Drospirenone (SLYND) 4 MG TABS   Encounter for initial prescription of contraceptive pills          Screening: -- Blood pressure screen normal -- Weight screening: normal -- Depression screening negative (PHQ-9) -- Nutrition: normal -- cholesterol screening: not due for screening -- osteoporosis screening: not due -- tobacco screening: not using -- alcohol screening: AUDIT questionnaire indicates low-risk usage. -- family history of breast cancer  screening: done. not at high risk. -- no evidence of domestic violence or intimate partner violence. -- STD screening: gonorrhea/chlamydia NAAT collected -- pap smear collected per ASCCP guidelines  Family history of ovarian cancer: The patient states that her mom tells her that she had endometriosis.  She can't remember whether her mom had uterus taken at the same time.  She will consider testing.    Thomasene Mohair, MD 01/18/2020 1:41 PM

## 2021-01-22 LAB — CERVICOVAGINAL ANCILLARY ONLY
Chlamydia: NEGATIVE
Comment: NEGATIVE
Comment: NEGATIVE
Comment: NORMAL
Neisseria Gonorrhea: NEGATIVE
Trichomonas: NEGATIVE

## 2021-01-23 LAB — CYTOLOGY - PAP
Comment: NEGATIVE
Diagnosis: NEGATIVE
Diagnosis: REACTIVE
High risk HPV: NEGATIVE

## 2022-01-13 ENCOUNTER — Telehealth: Payer: Self-pay

## 2022-01-13 NOTE — Telephone Encounter (Signed)
Fax refill request received from [facility] for  Drospirenone (SLYND) 4 MG TABS  Last filled: 10/25/2021  QTY:84

## 2022-01-13 NOTE — Telephone Encounter (Signed)
Returned fax: Denied with note: Patient needs appointment. (Last annual 12/2020)
